# Patient Record
Sex: Female | Born: 1935 | Race: Black or African American | Hispanic: No | Smoking: Never smoker
Health system: Southern US, Community
[De-identification: ages and names within clinical notes are randomized; demographics above are authoritative.]

## PROBLEM LIST (undated history)

## (undated) DIAGNOSIS — K8689 Other specified diseases of pancreas: Secondary | ICD-10-CM

## (undated) DIAGNOSIS — E079 Disorder of thyroid, unspecified: Secondary | ICD-10-CM

## (undated) DIAGNOSIS — J45909 Unspecified asthma, uncomplicated: Secondary | ICD-10-CM

## (undated) DIAGNOSIS — E119 Type 2 diabetes mellitus without complications: Secondary | ICD-10-CM

## (undated) DIAGNOSIS — I10 Essential (primary) hypertension: Secondary | ICD-10-CM

## (undated) HISTORY — PX: CHOLECYSTECTOMY: SHX55

## (undated) HISTORY — PX: THYROID SURGERY: SHX805

## (undated) HISTORY — PX: APPENDECTOMY: SHX54

---

## 1999-01-21 ENCOUNTER — Ambulatory Visit (HOSPITAL_COMMUNITY): Admission: RE | Admit: 1999-01-21 | Discharge: 1999-01-21 | Payer: Self-pay

## 2004-10-01 ENCOUNTER — Emergency Department (HOSPITAL_COMMUNITY): Admission: EM | Admit: 2004-10-01 | Discharge: 2004-10-01 | Payer: Self-pay | Admitting: Emergency Medicine

## 2004-10-04 ENCOUNTER — Ambulatory Visit (HOSPITAL_COMMUNITY): Admission: RE | Admit: 2004-10-04 | Discharge: 2004-10-04 | Payer: Self-pay | Admitting: Orthopedic Surgery

## 2004-11-30 ENCOUNTER — Encounter: Admission: RE | Admit: 2004-11-30 | Discharge: 2004-12-22 | Payer: Self-pay | Admitting: Orthopedic Surgery

## 2005-07-13 ENCOUNTER — Emergency Department (HOSPITAL_COMMUNITY): Admission: EM | Admit: 2005-07-13 | Discharge: 2005-07-13 | Payer: Self-pay | Admitting: Emergency Medicine

## 2005-07-28 ENCOUNTER — Ambulatory Visit (HOSPITAL_BASED_OUTPATIENT_CLINIC_OR_DEPARTMENT_OTHER): Admission: RE | Admit: 2005-07-28 | Discharge: 2005-07-28 | Payer: Self-pay | Admitting: Cardiology

## 2005-08-01 ENCOUNTER — Ambulatory Visit: Payer: Self-pay | Admitting: Internal Medicine

## 2006-05-10 ENCOUNTER — Encounter: Admission: RE | Admit: 2006-05-10 | Discharge: 2006-05-10 | Payer: Self-pay | Admitting: Cardiology

## 2006-05-15 ENCOUNTER — Encounter (HOSPITAL_COMMUNITY): Admission: RE | Admit: 2006-05-15 | Discharge: 2006-08-13 | Payer: Self-pay | Admitting: Cardiology

## 2006-05-22 ENCOUNTER — Encounter (INDEPENDENT_AMBULATORY_CARE_PROVIDER_SITE_OTHER): Payer: Self-pay | Admitting: Specialist

## 2006-05-22 ENCOUNTER — Encounter: Admission: RE | Admit: 2006-05-22 | Discharge: 2006-05-22 | Payer: Self-pay | Admitting: Cardiology

## 2006-05-22 ENCOUNTER — Other Ambulatory Visit: Admission: RE | Admit: 2006-05-22 | Discharge: 2006-05-22 | Payer: Self-pay | Admitting: Interventional Radiology

## 2006-07-26 ENCOUNTER — Encounter (INDEPENDENT_AMBULATORY_CARE_PROVIDER_SITE_OTHER): Payer: Self-pay | Admitting: *Deleted

## 2006-07-26 ENCOUNTER — Ambulatory Visit (HOSPITAL_COMMUNITY): Admission: RE | Admit: 2006-07-26 | Discharge: 2006-07-27 | Payer: Self-pay | Admitting: General Surgery

## 2007-04-11 ENCOUNTER — Encounter: Admission: RE | Admit: 2007-04-11 | Discharge: 2007-04-11 | Payer: Self-pay | Admitting: Cardiology

## 2008-05-19 ENCOUNTER — Ambulatory Visit (HOSPITAL_COMMUNITY): Admission: RE | Admit: 2008-05-19 | Discharge: 2008-05-19 | Payer: Self-pay | Admitting: Cardiology

## 2008-08-05 ENCOUNTER — Ambulatory Visit: Admission: RE | Admit: 2008-08-05 | Discharge: 2008-08-05 | Payer: Self-pay | Admitting: Endocrinology

## 2010-01-05 ENCOUNTER — Encounter: Admission: RE | Admit: 2010-01-05 | Discharge: 2010-01-05 | Payer: Self-pay | Admitting: Cardiology

## 2010-12-19 ENCOUNTER — Encounter: Payer: Self-pay | Admitting: Cardiology

## 2011-04-15 NOTE — Procedures (Signed)
NAME:  [name], [name]                  ACCOUNT NO.:  [id number]   MEDICAL RECORD NO.:  [id number]          PATIENT TYPE:  OUT   LOCATION:  SLEEP CENTER                 FACILITY:  [facility]   PHYSICIAN:  Clinton D. Maple Hudson, M.D. DATE OF BIRTH:  [birth year]   DATE OF STUDY:  07/28/2005                              NOCTURNAL POLYSOMNOGRAM   REFERRING PHYSICIAN:  Dr. Kevin Fenton Spruill   DATE OF STUDY:  July 28, 2005.   INDICATION FOR STUDY:  Hypersomnia with sleep apnea. Epworth Sleepiness  Score 5/24, BMI 34, weight 172 pounds.   SLEEP ARCHITECTURE:  Total sleep time 340 minutes with sleep efficiency 62%.  Stage I was 2%, stage II 65%, stages III and IV 21%, REM was 13% of total  sleep time. Sleep latency 48 minutes, REM latency 126 minutes, awake after  sleep onset 156 minutes, arousal index 11.5. Bedtime medication was aspirin.   RESPIRATORY DATA:  Split study protocol. Apnea-hypopnea index (AHI, RDI) 9.4  obstructive events per hour indicating mild obstructive sleep apnea/hypopnea  syndrome before CPAP. There were 1 central apnea, 15 obstructive apneas and  37 hypopneas before CPAP. CPAP was titrated to 8 CWP, AHI 1 per hour, using  a petite ComfortGel Mask with heated humidifier.   OXYGEN DATA:  Mild snoring with oxygen desaturation to a nadir of 87% before  CPAP. After CPAP control saturation held 94-98% on room air.   CARDIAC DATA:  Normal sinus rhythm.   MOVEMENT/PARASOMNIA:  Occasional leg jerks with little effect on sleep.   IMPRESSION/RECOMMENDATION:  1.  Mild obstructive sleep apnea/hypopnea syndrome, apnea-hypopnea index 9.4      per hour, with light snoring and oxygen desaturation to 87%.  2.  Continuous positive airway pressure titrated to 8 CWP, apnea-hypopnea      index of 1 per hour, using a petite ComfortGel Mask with heated      humidifier.     Clinton D. Maple Hudson, M.D.  Diplomate, Biomedical engineer of Sleep Medicine  Electronically Signed    CDY/MEDQ  D:  07/31/2005  15:31:13  T:  07/31/2005 22:18:32  Job:  [id number]

## 2011-04-15 NOTE — Op Note (Signed)
[name], [name]                  ACCOUNT NO.:  [id number]   MEDICAL RECORD NO.:  [id number]          PATIENT TYPE:  OIB   LOCATION:  5705                         FACILITY:  [facility]   PHYSICIAN:  Leonie Man, M.D.   DATE OF BIRTH:  02-18-1928   DATE OF PROCEDURE:  07/26/2006  DATE OF DISCHARGE:                                 OPERATIVE REPORT   PREOPERATIVE DIAGNOSIS:  Multinodular goiter, rule out carcinoma.   POSTOPERATIVE DIAGNOSIS:  Multinodular goiter, rule out carcinoma.   PROCEDURE:  Total thyroidectomy.   SURGEON:  Leonie Man, M.D.   ASSISTANT:  Lebron Conners, M.D.   ANESTHESIA:  General.   SPECIMENS TO LAB:  Thyroid gland.   ESTIMATED BLOOD LOSS:  Minimal.   COMPLICATIONS:  None apparent.   DISPOSITION:  The patient returned to the PACU in excellent condition.   INDICATIONS:  [name] is a 77 year old lady who discovered a large mass  in her neck.  She underwent ultrasound and was noted to have a large cold  nodule occupying the left lobe of her thyroid which, on biopsy, did not show  any evidence of carcinoma.  The lobe on the left measures 6.8 cm and the  thyroid lobe on the right measures 6.7 cm.  The patient had not been having  any significant symptoms of dysphasia or odynophagia.  She was not hoarse.  She comes to the operating room now for total thyroidectomy after the risks  and potential benefits of surgery have been discussed.  All questions were  answered, consent obtained for surgery.  The patient is aware of the risk of  hypoparathyroidism as well as recurrent laryngeal nerve injury.  She is  aware that she is going to be hypothyroid following surgery and will have to  be on medication for this for the rest of her life.   FINDINGS:  The left thyroid lobe was large, smooth, and white. The right  lobe of the thyroid was multinodular and multiple small lobes extending all  over the thyroid gland.  The thyroid isthmus had a large nodule within  it.  There was no pyramidal lobe noted.  There were no nodes of any significance  noted within the anterior compartments of the neck.   PROCEDURE:  The patient is positioned supinely with the head and neck  hyperextended.  The neck is prepped and draped to be included in a sterile  operative field.  A transverse incision is made through the skin,  subcutaneous tissues, and platysma with a superior flap being raised up to  the thyroid cartilage and an inferior flap carried down to the sternal  notch.  The midline strap muscles are opened up and dissection of the  thyroid was carried down initially on the left side.  The findings are as  noted above. The left lobe was mobilized up to the upper pole where the  upper pole vessels are isolated and doubly ligated and clipped and then  transected.  The lobe was then fully mobilized from out of the neck and  dissection carried down along the  thyroid gland being careful to avoid the  recurrent laryngeal nerve and to spare both the upper and lower pole  parathyroid glands.  Hemostasis was obtained along the course of dissection  with clips.  The lobe was then rolled medially over towards the trachea and  the lobe was dissected free from the trachea and this was carried across the  midline.  Attention was then turned to the right lobe at which point, the  right lobe was also mobilized from under the strap muscles and dissection  carried up towards the upper pole and the upper pole vessels were isolated  and ligated with 2-0 silk sutures and then reinforced with a medium clip.  The right upper pole vessels were then divided and the thyroid rolled  medially.  Both the right and left recurrent laryngeal nerves were  positively identified and spared throughout the course of the dissection as  the thyroid was dissected free from its attachments to the trachea.  Hemostasis was obtained with clips and with electrocautery as necessary.  The entire thyroid  gland was then dissected free from off of the trachea,  removed and forwarded for pathologic evaluation.  The right lobe upper pole  was identified with a single suture.  Both the right and left portions of  the neck were irrigated with normal saline.  Additional bleeding points  treated electrocautery.  Sponge and instrument counts were verified.  A  patch of Surgicel was placed in both sides of the neck for additional  hemostasis.  Sponge and instrument counts were verified.  The midline strap  muscles were closed with running 3-0 Vicryl suture.  The platysma was closed  with interrupted Vicryl sutures and the skin was closed with running 5-0  Monocryl suture and then reinforced with Steri-Strips.  Sterile dressings  were applied.  The anesthetic was reversed.  The patient was then removed  from the operating room to the recovery room in stable condition.  She  tolerated the procedure well.      Leonie Man, M.D.  Electronically Signed     PB/MEDQ  D:  07/26/2006  T:  07/26/2006  Job:  [id number]

## 2011-04-15 NOTE — Op Note (Signed)
[name], [name]                  ACCOUNT NO.:  [id number]   MEDICAL RECORD NO.:  [id number]          PATIENT TYPE:  OIB   LOCATION:  2899                         FACILITY:  [facility]   PHYSICIAN:  Mila Homer. Sherlean Foot, M.D. DATE OF BIRTH:  05-07-1928   DATE OF PROCEDURE:  10/04/2004  DATE OF DISCHARGE:                                 OPERATIVE REPORT   PREOPERATIVE DIAGNOSIS:  Left lateral malleolus fracture.   POSTOPERATIVE DIAGNOSIS:  Left lateral malleolus fracture.   PROCEDURE:  Left lateral malleolus open reduction and internal fixation.   SURGEON:  Mila Homer. Sherlean Foot, M.D.   ASSISTANT:  Jamelle Rushing, P.A.   ANESTHESIA:  General.   INDICATIONS FOR PROCEDURE:  The patient is a 77 year old who fell on  Thursday, and came in to see me on Friday in the office.  An informed  consent was obtained for an elective ORIF of the lateral malleolus plus or  minus medial reconstruction.   DESCRIPTION OF PROCEDURE:  The patient was laid supine and administered  general anesthesia.  The left lower extremity was prepped and draped in the  usual sterile fashion.  A straight lateral incision was made over the  lateral malleolus and went down sharply to the fibula in the proximal 50% of  the wound, and in the distal 50% of the wound I used blunt dissection, to  take care not to injure the superficial peroneal nerve.  I then identified  the fracture and cleaned out the fracture site with a knife, a dental pick  and irrigation.  I then used a standard bone reduction forceps to reduce the  fracture and to hold it in place.  I then drilled a lag screw from anterior  to posterior, and measured 20 mm.  I then fashioned a semi-tubular plate to  the lateral cortex of the fibula and filled two screws distally with full-  threaded cancellus screws and then four screws proximally with bicortical  screws.  I then checked on C-arm to insure we had good anatomic alignment of  both medial and lateral sides, as  well as the posterior plafond on C-arm.  Everything was anatomically reduced, so I then irrigated and closed with  interrupted #0 and #2-0 Vicryl and skin staples.  Dressed with Adaptic, 4 x  4s, posterior splint, sterile Webril and an Ace wrap.   COMPLICATIONS:  None.   DRAINS:  None.   TOURNIQUET TIME:  Was 21 minutes.       SDL/MEDQ  D:  10/04/2004  T:  10/04/2004  Job:  [id number]

## 2011-08-25 LAB — COMPREHENSIVE METABOLIC PANEL
ALT: 10
AST: 29
CO2: 30
Chloride: 100
Creatinine, Ser: 1.23 — ABNORMAL HIGH
GFR calc Af Amer: 51 — ABNORMAL LOW
GFR calc non Af Amer: 42 — ABNORMAL LOW
Sodium: 139
Total Bilirubin: 0.6

## 2011-08-25 LAB — URINALYSIS, MICROSCOPIC ONLY
Bilirubin Urine: NEGATIVE
Glucose, UA: NEGATIVE
Hgb urine dipstick: NEGATIVE
Ketones, ur: NEGATIVE
Nitrite: NEGATIVE
Protein, ur: NEGATIVE
Specific Gravity, Urine: 1.015
Urobilinogen, UA: 0.2
pH: 7.5

## 2011-08-25 LAB — CBC
Hemoglobin: 11.1 — ABNORMAL LOW
MCV: 75.6 — ABNORMAL LOW
RBC: 4.64
WBC: 5.9

## 2011-08-25 LAB — TSH: TSH: 3.786

## 2011-08-25 LAB — T4: T4, Total: 8.2

## 2012-04-24 ENCOUNTER — Other Ambulatory Visit: Payer: Self-pay | Admitting: Cardiology

## 2013-06-26 ENCOUNTER — Emergency Department (INDEPENDENT_AMBULATORY_CARE_PROVIDER_SITE_OTHER)
Admission: EM | Admit: 2013-06-26 | Discharge: 2013-06-26 | Disposition: A | Discharge status: Home / Self Care | Payer: Medicare Other | Source: Home / Self Care | Attending: Family Medicine | Admitting: Family Medicine

## 2013-06-26 ENCOUNTER — Encounter (HOSPITAL_COMMUNITY): Payer: Self-pay | Admitting: *Deleted

## 2013-06-26 ENCOUNTER — Ambulatory Visit (HOSPITAL_COMMUNITY): Payer: Medicare Other | Attending: Family Medicine

## 2013-06-26 DIAGNOSIS — J45901 Unspecified asthma with (acute) exacerbation: Secondary | ICD-10-CM

## 2013-06-26 DIAGNOSIS — R059 Cough, unspecified: Secondary | ICD-10-CM | POA: Insufficient documentation

## 2013-06-26 DIAGNOSIS — R05 Cough: Secondary | ICD-10-CM | POA: Insufficient documentation

## 2013-06-26 DIAGNOSIS — J189 Pneumonia, unspecified organism: Secondary | ICD-10-CM

## 2013-06-26 DIAGNOSIS — R9389 Abnormal findings on diagnostic imaging of other specified body structures: Secondary | ICD-10-CM | POA: Insufficient documentation

## 2013-06-26 HISTORY — DX: Disorder of thyroid, unspecified: E07.9

## 2013-06-26 HISTORY — DX: Type 2 diabetes mellitus without complications: E11.9

## 2013-06-26 HISTORY — DX: Essential (primary) hypertension: I10

## 2013-06-26 HISTORY — DX: Unspecified asthma, uncomplicated: J45.909

## 2013-06-26 MED ORDER — PREDNISONE 10 MG PO TABS: ORAL_TABLET | ORAL | Status: DC

## 2013-06-26 MED ORDER — ALBUTEROL SULFATE (5 MG/ML) 0.5% IN NEBU
2.5000 mg | INHALATION_SOLUTION | Freq: Once | RESPIRATORY_TRACT | Status: AC
  Administered 2013-06-26: 2.5 mg via RESPIRATORY_TRACT

## 2013-06-26 MED ORDER — ALBUTEROL SULFATE (5 MG/ML) 0.5% IN NEBU
INHALATION_SOLUTION | RESPIRATORY_TRACT | Status: AC
  Filled 2013-06-26: qty 0.5

## 2013-06-26 MED ORDER — IPRATROPIUM BROMIDE 0.02 % IN SOLN
0.2500 mg | Freq: Once | RESPIRATORY_TRACT | Status: AC
  Administered 2013-06-26: 0.26 mg via RESPIRATORY_TRACT

## 2013-06-26 MED ORDER — ALBUTEROL SULFATE HFA 108 (90 BASE) MCG/ACT IN AERS: 1.0000 | INHALATION_SPRAY | Freq: Four times a day (QID) | RESPIRATORY_TRACT | Status: DC | PRN

## 2013-06-26 MED ORDER — METHYLPREDNISOLONE SODIUM SUCC 125 MG IJ SOLR
INTRAMUSCULAR | Status: AC
  Filled 2013-06-26: qty 2

## 2013-06-26 MED ORDER — DOXYCYCLINE HYCLATE 100 MG PO CAPS: 100.0000 mg | ORAL_CAPSULE | Freq: Two times a day (BID) | ORAL | Status: DC

## 2013-06-26 MED ORDER — METHYLPREDNISOLONE SODIUM SUCC 125 MG IJ SOLR
80.0000 mg | Freq: Once | INTRAMUSCULAR | Status: AC
  Administered 2013-06-26: 80 mg via INTRAMUSCULAR

## 2013-06-26 MED ORDER — IPRATROPIUM BROMIDE 0.02 % IN SOLN: 0.2500 mg | Freq: Once | RESPIRATORY_TRACT | Status: DC

## 2013-06-26 MED ORDER — BENZONATATE 100 MG PO CAPS: 100.0000 mg | ORAL_CAPSULE | Freq: Three times a day (TID) | ORAL | Status: DC

## 2013-06-26 NOTE — ED Notes (Signed)
Pt reports she inhaled some Raid insect repellant about a week ago.  The next day she started coughing  and noticed a runny nose.  She has also vomited about once daily  She denies fever or diarrhea.

## 2013-06-26 NOTE — ED Provider Notes (Signed)
CSN: [id number]     Arrival date & time 06/26/13  1216 History     First MD Initiated Contact with Patient 06/26/13 1317     Chief Complaint  Patient presents with  . Cough   (Consider location/radiation/quality/duration/timing/severity/associated sxs/prior Treatment) HPI Comments: An 79 year old female with remote history of asthma, diabetes and hypothyroidism among other comorbidities. Here complaining of persistent cough associated with episodes of wheezing intermittently during the last 6 days. Also reports intermittent clear phlegm with coughing and post tussive non bloody emesis. Denies current nasal congestion, sore throat, fever or chills. Also denies chest pain or painful breathing. Appetite is at base line. Pt. States she thinks symptoms were triggered after accidentally inhaling an aerosol insect repellant she used on her dog about a week ago. Patient reports that she has used sporadically an inhaler in the past given by her daughter who is a respiratory therapist.    Past Medical History  Diagnosis Date  . Asthma   . Diabetes mellitus without complication   . Hypertension   . Thyroid disease    Past Surgical History  Procedure Laterality Date  . Appendectomy    . Cholecystectomy    . Thyroid surgery     No family history on file. History  Substance Use Topics  . Smoking status: Never Smoker   . Smokeless tobacco: Not on file  . Alcohol Use: No   OB History   Grav Para Term Preterm Abortions TAB SAB Ect Mult Living                 Review of Systems  Constitutional: Negative for fever, chills, diaphoresis, appetite change and fatigue.  HENT: Negative for sore throat and rhinorrhea.   Eyes: Negative for discharge.  Respiratory: Positive for cough and wheezing.   Cardiovascular: Negative for chest pain, palpitations and leg swelling.  Gastrointestinal: Negative for nausea and abdominal pain.  Skin: Negative for rash.  Neurological: Negative for dizziness,  syncope and headaches.  All other systems reviewed and are negative.    Allergies  Review of patient's allergies indicates no known allergies.  Home Medications   Current Outpatient Rx  Name  Route  Sig  Dispense  Refill  . amLODipine-valsartan (EXFORGE) 5-160 MG per tablet   Oral   Take 1 tablet by mouth daily.         Marland Kitchen levothyroxine (SYNTHROID, LEVOTHROID) 112 MCG tablet   Oral   Take 112 mcg by mouth daily before breakfast.         . metFORMIN (GLUCOPHAGE) 500 MG tablet   Oral   Take 500 mg by mouth 2 (two) times daily with a meal.         . rosuvastatin (CRESTOR) 5 MG tablet   Oral   Take 5 mg by mouth daily.         Marland Kitchen albuterol (PROVENTIL HFA;VENTOLIN HFA) 108 (90 BASE) MCG/ACT inhaler   Inhalation   Inhale 1-2 puffs into the lungs every 6 (six) hours as needed for wheezing.   1 Inhaler   0   . benzonatate (TESSALON) 100 MG capsule   Oral   Take 1 capsule (100 mg total) by mouth every 8 (eight) hours.   21 capsule   0   . doxycycline (VIBRAMYCIN) 100 MG capsule   Oral   Take 1 capsule (100 mg total) by mouth 2 (two) times daily.   20 capsule   0   . predniSONE (DELTASONE) 10 MG tablet  Take  4tabs po on day #1 then          3 tabs po on day#2         2 tbas po on day#3         1 tab po on  day#4       1/2 tab po on day#5   11 tablet   0    BP 105/66  Pulse 84  Temp(Src) 98.2 F (36.8 C) (Oral)  Resp 20  SpO2 98% Physical Exam  Nursing note and vitals reviewed. Constitutional: She is oriented to person, place, and time. She appears well-developed and well-nourished. No distress.  HENT:  Head: Normocephalic and atraumatic.  Right Ear: External ear normal.  Left Ear: External ear normal.  Nose: Nose normal.  Mouth/Throat: Oropharynx is clear and moist. No oropharyngeal exudate.  Eyes: Conjunctivae and EOM are normal. Pupils are equal, round, and reactive to light. Right eye exhibits no discharge. Left eye exhibits no discharge. No  scleral icterus.  Neck: Neck supple. No JVD present. No thyromegaly present.  Cardiovascular: Normal rate, regular rhythm and normal heart sounds.  Exam reveals no gallop and no friction rub.   No murmur heard. Pulmonary/Chest:  (Prior nebulization) Bilateral expiratory rhonchi, no tachypnea, no orthopnea, no crackles.  Abdominal: Soft. There is no tenderness.  Lymphadenopathy:    She has no cervical adenopathy.  Neurological: She is alert and oriented to person, place, and time.  Skin: No rash noted. She is not diaphoretic.    ED Course   Procedures (including critical care time)  Labs Reviewed - No data to display Dg Chest 2 View  06/26/2013   *RADIOLOGY REPORT*  Clinical Data: Cough  CHEST - 2 VIEW  Comparison: January 05, 2010.  Findings: Cardiomediastinal silhouette appears normal.  Left lung is clear.  No pneumothorax or pleural effusion is noted.  Minimal linear density is noted in the right upper lobe concerning for subsegmental atelectasis or possibly early pneumonia.  Bony thorax is intact.  IMPRESSION: New faint right upper lobe opacity concerning for subsegmental atelectasis or possibly pneumonia.   Original Report Authenticated By: Lupita Raider.,  M.D.   1. Asthma exacerbation, mild   2. Community acquired pneumonia     MDM  Clinical impression of asthma exacerbation. Otherwise patient is clinically well. Still decided to cover with doxycycline for possible early signs of community acquire pneumonia in right upper lobe. Was treated here with albuterol nebulization 2.5 mg x2, ipratropium 2.5 mcg x1 and Solu-Medrol 80 mg IM x1 with significant improvement of her symptoms and lung examination. Prescribed prednisone taper, albuterol inhaler, Tessalon Perles and doxycycline. Was asked to follow up with her primary care provider in the next 3-5 days to monitor her symptoms and recovery. Patient was asked to return to medical attention if worsening or new symptoms despite  following treatment.  Sharin Grave, MD 06/27/13 [phone]

## 2014-12-28 ENCOUNTER — Encounter (HOSPITAL_COMMUNITY): Payer: Self-pay | Admitting: Cardiology

## 2014-12-28 ENCOUNTER — Emergency Department (HOSPITAL_COMMUNITY): Payer: Medicare Other

## 2014-12-28 ENCOUNTER — Emergency Department (HOSPITAL_COMMUNITY)
Admission: EM | Admit: 2014-12-28 | Discharge: 2014-12-28 | Disposition: A | Discharge status: Home / Self Care | Payer: Medicare Other | Attending: Emergency Medicine | Admitting: Emergency Medicine

## 2014-12-28 DIAGNOSIS — I10 Essential (primary) hypertension: Secondary | ICD-10-CM | POA: Insufficient documentation

## 2014-12-28 DIAGNOSIS — E1165 Type 2 diabetes mellitus with hyperglycemia: Secondary | ICD-10-CM | POA: Diagnosis not present

## 2014-12-28 DIAGNOSIS — R0789 Other chest pain: Secondary | ICD-10-CM

## 2014-12-28 DIAGNOSIS — Z792 Long term (current) use of antibiotics: Secondary | ICD-10-CM | POA: Diagnosis not present

## 2014-12-28 DIAGNOSIS — E079 Disorder of thyroid, unspecified: Secondary | ICD-10-CM | POA: Insufficient documentation

## 2014-12-28 DIAGNOSIS — J45909 Unspecified asthma, uncomplicated: Secondary | ICD-10-CM | POA: Insufficient documentation

## 2014-12-28 DIAGNOSIS — R079 Chest pain, unspecified: Secondary | ICD-10-CM | POA: Diagnosis present

## 2014-12-28 DIAGNOSIS — Z7952 Long term (current) use of systemic steroids: Secondary | ICD-10-CM | POA: Insufficient documentation

## 2014-12-28 DIAGNOSIS — E876 Hypokalemia: Secondary | ICD-10-CM | POA: Insufficient documentation

## 2014-12-28 DIAGNOSIS — Z79899 Other long term (current) drug therapy: Secondary | ICD-10-CM | POA: Insufficient documentation

## 2014-12-28 DIAGNOSIS — R739 Hyperglycemia, unspecified: Secondary | ICD-10-CM

## 2014-12-28 LAB — I-STAT TROPONIN, ED: Troponin i, poc: 0 ng/mL (ref 0.00–0.08)

## 2014-12-28 LAB — BASIC METABOLIC PANEL
Anion gap: 11 (ref 5–15)
BUN: 14 mg/dL (ref 6–23)
CALCIUM: 8.6 mg/dL (ref 8.4–10.5)
CHLORIDE: 93 mmol/L — AB (ref 96–112)
CO2: 25 mmol/L (ref 19–32)
CREATININE: 0.83 mg/dL (ref 0.50–1.10)
GFR calc non Af Amer: 63 mL/min — ABNORMAL LOW (ref >90)
GFR, EST AFRICAN AMERICAN: 73 mL/min — AB (ref >90)
Glucose, Bld: 309 mg/dL — ABNORMAL HIGH (ref 70–99)
POTASSIUM: 3.4 mmol/L — AB (ref 3.5–5.1)
SODIUM: 129 mmol/L — AB (ref 135–145)

## 2014-12-28 LAB — CBC
HEMATOCRIT: 29 % — AB (ref 36.0–46.0)
Hemoglobin: 9.2 g/dL — ABNORMAL LOW (ref 12.0–15.0)
MCH: 23.2 pg — ABNORMAL LOW (ref 26.0–34.0)
MCHC: 31.7 g/dL (ref 30.0–36.0)
MCV: 73 fL — AB (ref 78.0–100.0)
Platelets: 440 10*3/uL — ABNORMAL HIGH (ref 150–400)
RBC: 3.97 MIL/uL (ref 3.87–5.11)
RDW: 14.9 % (ref 11.5–15.5)
WBC: 7.5 10*3/uL (ref 4.0–10.5)

## 2014-12-28 LAB — CBG MONITORING, ED
GLUCOSE-CAPILLARY: 257 mg/dL — AB (ref 70–99)
GLUCOSE-CAPILLARY: 153 mg/dL — AB (ref 70–99)

## 2014-12-28 LAB — BRAIN NATRIURETIC PEPTIDE: B NATRIURETIC PEPTIDE 5: 107 pg/mL — AB (ref 0.0–100.0)

## 2014-12-28 MED ORDER — SODIUM CHLORIDE 0.9 % IV BOLUS (SEPSIS)
500.0000 mL | Freq: Once | INTRAVENOUS | Status: AC
  Administered 2014-12-28: 500 mL via INTRAVENOUS

## 2014-12-28 MED ORDER — INSULIN ASPART 100 UNIT/ML ~~LOC~~ SOLN
6.0000 [IU] | Freq: Once | SUBCUTANEOUS | Status: AC
  Administered 2014-12-28: 6 [IU] via SUBCUTANEOUS
  Filled 2014-12-28: qty 1

## 2014-12-28 MED ORDER — POTASSIUM CHLORIDE CRYS ER 20 MEQ PO TBCR
20.0000 meq | EXTENDED_RELEASE_TABLET | Freq: Once | ORAL | Status: AC
  Administered 2014-12-28: 20 meq via ORAL
  Filled 2014-12-28: qty 1

## 2014-12-28 NOTE — ED Notes (Signed)
CBG 153 

## 2014-12-28 NOTE — ED Notes (Signed)
Pt reports she started having some right sided chest pain this morning. States the alarm was triggered in her home and then she started having the pain. Pt denies any SOb or n/v.

## 2014-12-28 NOTE — ED Notes (Signed)
NAD at this time. Pt is stable and leaving with her family.

## 2014-12-28 NOTE — ED Provider Notes (Signed)
CSN: [id number]     Arrival date & time 12/28/14  0911 History   First MD Initiated Contact with Patient 12/28/14 0920     Chief Complaint  Patient presents with  . Chest Pain     (Consider location/radiation/quality/duration/timing/severity/associated sxs/prior Treatment) Patient is a 81 y.o. female presenting with chest pain. The history is provided by the patient.  Chest Pain Associated symptoms: no abdominal pain, no back pain, no cough, no fever, no headache, no nausea, no palpitations, no shortness of breath and not vomiting   pt c/o onset mid to right sided cp last night after home alarm kept going off. Was at rest at time of onset symptoms. Pain constant. Dull, mild, non radiating. No associated nv, diaphoresis or sob. Pain is not pleuritic. Pt denies any unusual doe or fatigue. No other recent cp or discomfort. Pt states she 'got to thinking and didn't want to be one of those people who waits too long'.   Pt denies hx cad or fam hx cad. No cough or uri c/o. No fever or chills. No chest wall injury or strain. No leg pain or swelling. No recent surgery or immobility. No hx dvt or pe. Non smoker.  States now that she has arrived in hospitals symptoms improved/resolved.     Past Medical History  Diagnosis Date  . Asthma   . Diabetes mellitus without complication   . Hypertension   . Thyroid disease    Past Surgical History  Procedure Laterality Date  . Appendectomy    . Cholecystectomy    . Thyroid surgery     History reviewed. No pertinent family history. History  Substance Use Topics  . Smoking status: Never Smoker   . Smokeless tobacco: Not on file  . Alcohol Use: No   OB History    No data available     Review of Systems  Constitutional: Negative for fever and chills.  HENT: Negative for sore throat.   Eyes: Negative for redness.  Respiratory: Negative for cough and shortness of breath.   Cardiovascular: Positive for chest pain. Negative for palpitations and leg  swelling.  Gastrointestinal: Negative for nausea, vomiting and abdominal pain.  Genitourinary: Negative for flank pain.  Musculoskeletal: Negative for back pain and neck pain.  Skin: Negative for rash.  Neurological: Negative for headaches.  Hematological: Does not bruise/bleed easily.  Psychiatric/Behavioral: Negative for confusion.      Allergies  Review of patient's allergies indicates no known allergies.  Home Medications   Prior to Admission medications   Medication Sig Start Date End Date Taking? Authorizing Provider  albuterol (PROVENTIL HFA;VENTOLIN HFA) 108 (90 BASE) MCG/ACT inhaler Inhale 1-2 puffs into the lungs every 6 (six) hours as needed for wheezing. 06/26/13   Adlih Moreno-Coll, MD  amLODipine-valsartan (EXFORGE) 5-160 MG per tablet Take 1 tablet by mouth daily.    Historical Provider, MD  benzonatate (TESSALON) 100 MG capsule Take 1 capsule (100 mg total) by mouth every 8 (eight) hours. 06/26/13   Adlih Moreno-Coll, MD  doxycycline (VIBRAMYCIN) 100 MG capsule Take 1 capsule (100 mg total) by mouth 2 (two) times daily. 06/26/13   Adlih Moreno-Coll, MD  levothyroxine (SYNTHROID, LEVOTHROID) 112 MCG tablet Take 112 mcg by mouth daily before breakfast.    Historical Provider, MD  metFORMIN (GLUCOPHAGE) 500 MG tablet Take 500 mg by mouth 2 (two) times daily with a meal.    Historical Provider, MD  predniSONE (DELTASONE) 10 MG tablet Take  4tabs po on day #1 then  3 tabs po on day#2         2 tbas po on day#3         1 tab po on  day#4       1/2 tab po on day#5 06/26/13   Adlih Moreno-Coll, MD  rosuvastatin (CRESTOR) 5 MG tablet Take 5 mg by mouth daily.    Historical Provider, MD   BP 170/73 mmHg  Pulse 112  Temp(Src) 97.9 F (36.6 C) (Oral)  Resp 18  SpO2 96% Physical Exam  Constitutional: She appears well-developed and well-nourished. No distress.  HENT:  Mouth/Throat: Oropharynx is clear and moist.  Eyes: Conjunctivae are normal. No scleral icterus.  Neck:  Neck supple. No tracheal deviation present.  Cardiovascular: Normal rate, regular rhythm, normal heart sounds and intact distal pulses.  Exam reveals no gallop and no friction rub.   No murmur heard. Pulmonary/Chest: Effort normal. No respiratory distress. She exhibits no tenderness.  Abdominal: Soft. Normal appearance. She exhibits no distension. There is no tenderness.  Genitourinary:  No cva tenderness  Musculoskeletal: She exhibits no edema or tenderness.  Neurological: She is alert.  Skin: Skin is warm and dry. No rash noted. She is not diaphoretic.  No shingles/rash in area of pain.   Psychiatric: She has a normal mood and affect.  Nursing note and vitals reviewed.   ED Course  Procedures (including critical care time) Labs Review  Results for orders placed or performed during the hospital encounter of 12/28/14  CBC  Result Value Ref Range   WBC 7.5 4.0 - 10.5 K/uL   RBC 3.97 3.87 - 5.11 MIL/uL   Hemoglobin 9.2 (L) 12.0 - 15.0 g/dL   HCT 29.0 (L) 36.0 - 46.0 %   MCV 73.0 (L) 78.0 - 100.0 fL   MCH 23.2 (L) 26.0 - 34.0 pg   MCHC 31.7 30.0 - 36.0 g/dL   RDW 14.9 11.5 - 15.5 %   Platelets 440 (H) 150 - 400 K/uL  Basic metabolic panel  Result Value Ref Range   Sodium 129 (L) 135 - 145 mmol/L   Potassium 3.4 (L) 3.5 - 5.1 mmol/L   Chloride 93 (L) 96 - 112 mmol/L   CO2 25 19 - 32 mmol/L   Glucose, Bld 309 (H) 70 - 99 mg/dL   BUN 14 6 - 23 mg/dL   Creatinine, Ser 0.83 0.50 - 1.10 mg/dL   Calcium 8.6 8.4 - 10.5 mg/dL   GFR calc non Af Amer 63 (L) >90 mL/min   GFR calc Af Amer 73 (L) >90 mL/min   Anion gap 11 5 - 15  BNP (order ONLY if patient complains of dyspnea/SOB AND you have documented it for THIS visit)  Result Value Ref Range   B Natriuretic Peptide 107.0 (H) 0.0 - 100.0 pg/mL  I-stat troponin, ED (not at [facility])  Result Value Ref Range   Troponin i, poc 0.00 0.00 - 0.08 ng/mL   Comment 3           Dg Chest 2 View  12/28/2014   CLINICAL DATA:  Left-sided chest  pain beginning the night of 12/27/2014.  EXAM: CHEST  2 VIEW  COMPARISON:  PA and lateral chest 06/26/2013 and 01/05/2010.  FINDINGS: The lungs are clear. The imaged heart size is normal. No pneumothorax or pleural effusion. No focal bony abnormality.  IMPRESSION: No acute disease.   Electronically Signed   By: Inge Rise M.D.   On: 12/28/2014 10:09  EKG Interpretation   Date/Time:  Sunday December 28 2014 09:18:04 EST Ventricular Rate:  103 PR Interval:  126 QRS Duration: 70 QT Interval:  336 QTC Calculation: 440 R Axis:   49 Text Interpretation:  Sinus tachycardia Nonspecific T wave abnormality  Baseline wander No significant change since last tracing Confirmed by  Nahsir Venezia  MD, Lennette Bihari ([id number]) on 12/28/2014 9:29:59 AM      MDM   Iv ns. Labs. Ecg. Cxr.  Reviewed nursing notes and prior charts for additional history.   Pt with stress 2009, negative.  No other recent cp or discomfort, no recent unusual doe or fatigue.  After cp since last evening, trop neg/zero.  cxr neg.  Pt w hx dm. bs 300. Iv ns bolus. novolog 6 sq. Po fluids. kcl po.   Pt to f/u w Dr Terrence Dupont this week re recent cp, as well as elevated bs and mildly low k.   Pt taking po fluids well. No cp or sob. Pt currently appears stable for d/c.   Return precautions discussed.     Mirna Mires, MD 12/28/14 [phone]

## 2014-12-28 NOTE — Discharge Instructions (Signed)
It was our pleasure to provide your ER care today - we hope that you feel better.  For chest pain, follow up with Dr Terrence Dupont in the next couple days - call his office tomorrow morning to verify appointment time.   From today's lab tests, your blood sugar is high (300).  Follow diabetic diet, continue your diabetic medication, and follow up with your doctor in the next few days - have blood pressure rechecked then, and discuss possible additional medication.  You are noted to have low blood count/anemia - with hemoglobin of 9.2 -  Follow up with your doctor this week, and discuss plan.   Also from today's labs, your potassium level is slightly low (3.4) - eat plenty of fruits and vegetables, and follow up with your doctor for recheck in 1 week.  Return to ER right away if worse, recurrent or persistent chest pain, trouble breathing, other concern.     Chest Pain (Nonspecific) It is often hard to give a specific diagnosis for the cause of chest pain. There is always a chance that your pain could be related to something serious, such as a heart attack or a blood clot in the lungs. You need to follow up with your health care provider for further evaluation. CAUSES   Heartburn.  Pneumonia or bronchitis.  Anxiety or stress.  Inflammation around your heart (pericarditis) or lung (pleuritis or pleurisy).  A blood clot in the lung.  A collapsed lung (pneumothorax). It can develop suddenly on its own (spontaneous pneumothorax) or from trauma to the chest.  Shingles infection (herpes zoster virus). The chest wall is composed of bones, muscles, and cartilage. Any of these can be the source of the pain.  The bones can be bruised by injury.  The muscles or cartilage can be strained by coughing or overwork.  The cartilage can be affected by inflammation and become sore (costochondritis). DIAGNOSIS  Lab tests or other studies may be needed to find the cause of your pain. Your health care  provider may have you take a test called an ambulatory electrocardiogram (ECG). An ECG records your heartbeat patterns over a 24-hour period. You may also have other tests, such as:  Transthoracic echocardiogram (TTE). During echocardiography, sound waves are used to evaluate how blood flows through your heart.  Transesophageal echocardiogram (TEE).  Cardiac monitoring. This allows your health care provider to monitor your heart rate and rhythm in real time.  Holter monitor. This is a portable device that records your heartbeat and can help diagnose heart arrhythmias. It allows your health care provider to track your heart activity for several days, if needed.  Stress tests by exercise or by giving medicine that makes the heart beat faster. TREATMENT   Treatment depends on what may be causing your chest pain. Treatment may include:  Acid blockers for heartburn.  Anti-inflammatory medicine.  Pain medicine for inflammatory conditions.  Antibiotics if an infection is present.  You may be advised to change lifestyle habits. This includes stopping smoking and avoiding alcohol, caffeine, and chocolate.  You may be advised to keep your head raised (elevated) when sleeping. This reduces the chance of acid going backward from your stomach into your esophagus. Most of the time, nonspecific chest pain will improve within 2-3 days with rest and mild pain medicine.  HOME CARE INSTRUCTIONS   If antibiotics were prescribed, take them as directed. Finish them even if you start to feel better.  For the next few days, avoid physical activities  that bring on chest pain. Continue physical activities as directed.  Do not use any tobacco products, including cigarettes, chewing tobacco, or electronic cigarettes.  Avoid drinking alcohol.  Only take medicine as directed by your health care provider.  Follow your health care provider's suggestions for further testing if your chest pain does not go  away.  Keep any follow-up appointments you made. If you do not go to an appointment, you could develop lasting (chronic) problems with pain. If there is any problem keeping an appointment, call to reschedule. SEEK MEDICAL CARE IF:   Your chest pain does not go away, even after treatment.  You have a rash with blisters on your chest.  You have a fever. SEEK IMMEDIATE MEDICAL CARE IF:   You have increased chest pain or pain that spreads to your arm, neck, jaw, back, or abdomen.  You have shortness of breath.  You have an increasing cough, or you cough up blood.  You have severe back or abdominal pain.  You feel nauseous or vomit.  You have severe weakness.  You faint.  You have chills. This is an emergency. Do not wait to see if the pain will go away. Get medical help at once. Call your local emergency services (911 in [place] Do not drive yourself to the hospital. MAKE SURE YOU:   Understand these instructions.  Will watch your condition.  Will get help right away if you are not doing well or get worse. Document Released: 08/24/2005 Document Revised: 11/19/2013 Document Reviewed: 06/19/2008 [facility] Patient Information 2015 [place], [place]. This information is not intended to replace advice given to you by your health care provider. Make sure you discuss any questions you have with your health care provider.     Hyperglycemia Hyperglycemia occurs when the glucose (sugar) in your blood is too high. Hyperglycemia can happen for many reasons, but it most often happens to people who do not know they have diabetes or are not managing their diabetes properly.  CAUSES  Whether you have diabetes or not, there are other causes of hyperglycemia. Hyperglycemia can occur when you have diabetes, but it can also occur in other situations that you might not be as aware of, such as: Diabetes  If you have diabetes and are having problems controlling your blood glucose, hyperglycemia could  occur because of some of the following reasons:  Not following your meal plan.  Not taking your diabetes medications or not taking it properly.  Exercising less or doing less activity than you normally do.  Being sick. Pre-diabetes  This cannot be ignored. Before people develop Type 2 diabetes, they almost always have "pre-diabetes." This is when your blood glucose levels are higher than normal, but not yet high enough to be diagnosed as diabetes. Research has shown that some long-term damage to the body, especially the heart and circulatory system, may already be occurring during pre-diabetes. If you take action to manage your blood glucose when you have pre-diabetes, you may delay or prevent Type 2 diabetes from developing. Stress  If you have diabetes, you may be "diet" controlled or on oral medications or insulin to control your diabetes. However, you may find that your blood glucose is higher than usual in the hospital whether you have diabetes or not. This is often referred to as "stress hyperglycemia." Stress can elevate your blood glucose. This happens because of hormones put out by the body during times of stress. If stress has been the cause of your high blood  glucose, it can be followed regularly by your caregiver. That way he/she can make sure your hyperglycemia does not continue to get worse or progress to diabetes. Steroids  Steroids are medications that act on the infection fighting system (immune system) to block inflammation or infection. One side effect can be a rise in blood glucose. Most people can produce enough extra insulin to allow for this rise, but for those who cannot, steroids make blood glucose levels go even higher. It is not unusual for steroid treatments to "uncover" diabetes that is developing. It is not always possible to determine if the hyperglycemia will go away after the steroids are stopped. A special blood test called an A1c is sometimes done to determine if  your blood glucose was elevated before the steroids were started. SYMPTOMS  Thirsty.  Frequent urination.  Dry mouth.  Blurred vision.  Tired or fatigue.  Weakness.  Sleepy.  Tingling in feet or leg. DIAGNOSIS  Diagnosis is made by monitoring blood glucose in one or all of the following ways:  A1c test. This is a chemical found in your blood.  Fingerstick blood glucose monitoring.  Laboratory results. TREATMENT  First, knowing the cause of the hyperglycemia is important before the hyperglycemia can be treated. Treatment may include, but is not be limited to:  Education.  Change or adjustment in medications.  Change or adjustment in meal plan.  Treatment for an illness, infection, etc.  More frequent blood glucose monitoring.  Change in exercise plan.  Decreasing or stopping steroids.  Lifestyle changes. HOME CARE INSTRUCTIONS   Test your blood glucose as directed.  Exercise regularly. Your caregiver will give you instructions about exercise. Pre-diabetes or diabetes which comes on with stress is helped by exercising.  Eat wholesome, balanced meals. Eat often and at regular, fixed times. Your caregiver or nutritionist will give you a meal plan to guide your sugar intake.  Being at an ideal weight is important. If needed, losing as little as 10 to 15 pounds may help improve blood glucose levels. SEEK MEDICAL CARE IF:   You have questions about medicine, activity, or diet.  You continue to have symptoms (problems such as increased thirst, urination, or weight gain). SEEK IMMEDIATE MEDICAL CARE IF:   You are vomiting or have diarrhea.  Your breath smells fruity.  You are breathing faster or slower.  You are very sleepy or incoherent.  You have numbness, tingling, or pain in your feet or hands.  You have chest pain.  Your symptoms get worse even though you have been following your caregiver's orders.  If you have any other questions or  concerns. Document Released: 05/10/2001 Document Revised: 02/06/2012 Document Reviewed: 03/12/2012 [facility] Patient Information 2015 [place], [place]. This information is not intended to replace advice given to you by your health care provider. Make sure you discuss any questions you have with your health care provider.     Diabetes Mellitus and Food It is important for you to manage your blood sugar (glucose) level. Your blood glucose level can be greatly affected by what you eat. Eating healthier foods in the appropriate amounts throughout the day at about the same time each day will help you control your blood glucose level. It can also help slow or prevent worsening of your diabetes mellitus. Healthy eating may even help you improve the level of your blood pressure and reach or maintain a healthy weight.  HOW CAN FOOD AFFECT ME? Carbohydrates Carbohydrates affect your blood glucose level more than  any other type of food. Your dietitian will help you determine how many carbohydrates to eat at each meal and teach you how to count carbohydrates. Counting carbohydrates is important to keep your blood glucose at a healthy level, especially if you are using insulin or taking certain medicines for diabetes mellitus. Alcohol Alcohol can cause sudden decreases in blood glucose (hypoglycemia), especially if you use insulin or take certain medicines for diabetes mellitus. Hypoglycemia can be a life-threatening condition. Symptoms of hypoglycemia (sleepiness, dizziness, and disorientation) are similar to symptoms of having too much alcohol.  If your health care provider has given you approval to drink alcohol, do so in moderation and use the following guidelines:  Women should not have more than one drink per day, and men should not have more than two drinks per day. One drink is equal to:  12 oz of beer.  5 oz of wine.  1 oz of hard liquor.  Do not drink on an empty stomach.  Keep yourself  hydrated. Have water, diet soda, or unsweetened iced tea.  Regular soda, juice, and other mixers might contain a lot of carbohydrates and should be counted. WHAT FOODS ARE NOT RECOMMENDED? As you make food choices, it is important to remember that all foods are not the same. Some foods have fewer nutrients per serving than other foods, even though they might have the same number of calories or carbohydrates. It is difficult to get your body what it needs when you eat foods with fewer nutrients. Examples of foods that you should avoid that are high in calories and carbohydrates but low in nutrients include:  Trans fats (most processed foods list trans fats on the Nutrition Facts label).  Regular soda.  Juice.  Candy.  Sweets, such as cake, pie, doughnuts, and cookies.  Fried foods. WHAT FOODS CAN I EAT? Have nutrient-rich foods, which will nourish your body and keep you healthy. The food you should eat also will depend on several factors, including:  The calories you need.  The medicines you take.  Your weight.  Your blood glucose level.  Your blood pressure level.  Your cholesterol level. You also should eat a variety of foods, including:  Protein, such as meat, poultry, fish, tofu, nuts, and seeds (lean animal proteins are best).  Fruits.  Vegetables.  Dairy products, such as milk, cheese, and yogurt (low fat is best).  Breads, grains, pasta, cereal, rice, and beans.  Fats such as olive oil, trans fat-free margarine, canola oil, avocado, and olives. DOES EVERYONE WITH DIABETES MELLITUS HAVE THE SAME MEAL PLAN? Because every person with diabetes mellitus is different, there is not one meal plan that works for everyone. It is very important that you meet with a dietitian who will help you create a meal plan that is just right for you. Document Released: 08/11/2005 Document Revised: 11/19/2013 Document Reviewed: 10/11/2013 [facility] Patient Information 2015 [place],  [place]. This information is not intended to replace advice given to you by your health care provider. Make sure you discuss any questions you have with your health care provider.    Hypokalemia Hypokalemia means that the amount of potassium in the blood is lower than normal.Potassium is a chemical, called an electrolyte, that helps regulate the amount of fluid in the body. It also stimulates muscle contraction and helps nerves function properly.Most of the body's potassium is inside of cells, and only a very small amount is in the blood. Because the amount in the blood is so small,  minor changes can be life-threatening. CAUSES  Antibiotics.  Diarrhea or vomiting.  Using laxatives too much, which can cause diarrhea.  Chronic kidney disease.  Water pills (diuretics).  Eating disorders (bulimia).  Low magnesium level.  Sweating a lot. SIGNS AND SYMPTOMS  Weakness.  Constipation.  Fatigue.  Muscle cramps.  Mental confusion.  Skipped heartbeats or irregular heartbeat (palpitations).  Tingling or numbness. DIAGNOSIS  Your health care provider can diagnose hypokalemia with blood tests. In addition to checking your potassium level, your health care provider may also check other lab tests. TREATMENT Hypokalemia can be treated with potassium supplements taken by mouth or adjustments in your current medicines. If your potassium level is very low, you may need to get potassium through a vein (IV) and be monitored in the hospital. A diet high in potassium is also helpful. Foods high in potassium are:  Nuts, such as peanuts and pistachios.  Seeds, such as sunflower seeds and pumpkin seeds.  Peas, lentils, and lima beans.  Whole grain and bran cereals and breads.  Fresh fruit and vegetables, such as apricots, avocado, bananas, cantaloupe, kiwi, oranges, tomatoes, asparagus, and potatoes.  Orange and tomato juices.  Red meats.  Fruit yogurt. HOME CARE INSTRUCTIONS  Take all  medicines as prescribed by your health care provider.  Maintain a healthy diet by including nutritious food, such as fruits, vegetables, nuts, whole grains, and lean meats.  If you are taking a laxative, be sure to follow the directions on the label. SEEK MEDICAL CARE IF:  Your weakness gets worse.  You feel your heart pounding or racing.  You are vomiting or having diarrhea.  You are diabetic and having trouble keeping your blood glucose in the normal range. SEEK IMMEDIATE MEDICAL CARE IF:  You have chest pain, shortness of breath, or dizziness.  You are vomiting or having diarrhea for more than 2 days.  You faint. MAKE SURE YOU:   Understand these instructions.  Will watch your condition.  Will get help right away if you are not doing well or get worse. Document Released: 11/14/2005 Document Revised: 09/04/2013 Document Reviewed: 05/17/2013 [facility] Patient Information 2015 [place], [place]. This information is not intended to replace advice given to you by your health care provider. Make sure you discuss any questions you have with your health care provider.    Anemia, Nonspecific Anemia is a condition in which the concentration of red blood cells or hemoglobin in the blood is below normal. Hemoglobin is a substance in red blood cells that carries oxygen to the tissues of the body. Anemia results in not enough oxygen reaching these tissues.  CAUSES  Common causes of anemia include:   Excessive bleeding. Bleeding may be internal or external. This includes excessive bleeding from periods (in women) or from the intestine.   Poor nutrition.   Chronic kidney, thyroid, and liver disease.  Bone marrow disorders that decrease red blood cell production.  Cancer and treatments for cancer.  HIV, AIDS, and their treatments.  Spleen problems that increase red blood cell destruction.  Blood disorders.  Excess destruction of red blood cells due to infection, medicines, and  autoimmune disorders. SIGNS AND SYMPTOMS   Minor weakness.   Dizziness.   Headache.  Palpitations.   Shortness of breath, especially with exercise.   Paleness.  Cold sensitivity.  Indigestion.  Nausea.  Difficulty sleeping.  Difficulty concentrating. Symptoms may occur suddenly or they may develop slowly.  DIAGNOSIS  Additional blood tests are often needed. These help your  health care provider determine the best treatment. Your health care provider will check your stool for blood and look for other causes of blood loss.  TREATMENT  Treatment varies depending on the cause of the anemia. Treatment can include:   Supplements of iron, vitamin O29, or folic acid.   Hormone medicines.   A blood transfusion. This may be needed if blood loss is severe.   Hospitalization. This may be needed if there is significant continual blood loss.   Dietary changes.  Spleen removal. HOME CARE INSTRUCTIONS Keep all follow-up appointments. It often takes many weeks to correct anemia, and having your health care provider check on your condition and your response to treatment is very important. SEEK IMMEDIATE MEDICAL CARE IF:   You develop extreme weakness, shortness of breath, or chest pain.   You become dizzy or have trouble concentrating.  You develop heavy vaginal bleeding.   You develop a rash.   You have bloody or black, tarry stools.   You faint.   You vomit up blood.   You vomit repeatedly.   You have abdominal pain.  You have a fever or persistent symptoms for more than 2-3 days.   You have a fever and your symptoms suddenly get worse.   You are dehydrated.  MAKE SURE YOU:  Understand these instructions.  Will watch your condition.  Will get help right away if you are not doing well or get worse. Document Released: 12/22/2004 Document Revised: 07/17/2013 Document Reviewed: 05/10/2013 [facility] Patient Information 2015 [place], [place]. This  information is not intended to replace advice given to you by your health care provider. Make sure you discuss any questions you have with your health care provider.

## 2014-12-31 ENCOUNTER — Other Ambulatory Visit (HOSPITAL_COMMUNITY): Payer: Self-pay | Admitting: Cardiology

## 2014-12-31 DIAGNOSIS — R079 Chest pain, unspecified: Secondary | ICD-10-CM

## 2015-01-09 ENCOUNTER — Other Ambulatory Visit (HOSPITAL_COMMUNITY): Payer: Medicare Other

## 2015-01-09 ENCOUNTER — Other Ambulatory Visit: Payer: Self-pay

## 2015-01-09 ENCOUNTER — Ambulatory Visit (HOSPITAL_COMMUNITY): Payer: Medicare Other

## 2015-01-09 ENCOUNTER — Encounter (HOSPITAL_COMMUNITY)
Admission: RE | Admit: 2015-01-09 | Discharge: 2015-01-09 | Disposition: A | Discharge status: Home / Self Care | Payer: Medicare Other | Source: Ambulatory Visit | Attending: Cardiology | Admitting: Cardiology

## 2015-01-09 DIAGNOSIS — R079 Chest pain, unspecified: Secondary | ICD-10-CM | POA: Diagnosis not present

## 2015-01-09 MED ORDER — REGADENOSON 0.4 MG/5ML IV SOLN
INTRAVENOUS | Status: AC
  Filled 2015-01-09: qty 5

## 2015-01-09 MED ORDER — REGADENOSON 0.4 MG/5ML IV SOLN
0.4000 mg | Freq: Once | INTRAVENOUS | Status: AC
  Administered 2015-01-09: 0.4 mg via INTRAVENOUS

## 2015-01-09 MED ORDER — TECHNETIUM TC 99M SESTAMIBI GENERIC - CARDIOLITE
10.0000 | Freq: Once | INTRAVENOUS | Status: AC | PRN
  Administered 2015-01-09: 10 via INTRAVENOUS

## 2015-01-09 MED ORDER — TECHNETIUM TC 99M SESTAMIBI GENERIC - CARDIOLITE
30.0000 | Freq: Once | INTRAVENOUS | Status: AC | PRN
  Administered 2015-01-09: 30 via INTRAVENOUS

## 2015-05-25 ENCOUNTER — Other Ambulatory Visit: Payer: Self-pay

## 2016-02-10 ENCOUNTER — Encounter (INDEPENDENT_AMBULATORY_CARE_PROVIDER_SITE_OTHER): Payer: Medicare Other | Admitting: Ophthalmology

## 2016-02-10 DIAGNOSIS — H43813 Vitreous degeneration, bilateral: Secondary | ICD-10-CM | POA: Diagnosis not present

## 2016-02-10 DIAGNOSIS — H35033 Hypertensive retinopathy, bilateral: Secondary | ICD-10-CM | POA: Diagnosis not present

## 2016-02-10 DIAGNOSIS — I10 Essential (primary) hypertension: Secondary | ICD-10-CM

## 2016-02-10 DIAGNOSIS — D3131 Benign neoplasm of right choroid: Secondary | ICD-10-CM

## 2016-02-10 DIAGNOSIS — H35372 Puckering of macula, left eye: Secondary | ICD-10-CM | POA: Diagnosis not present

## 2016-03-18 ENCOUNTER — Other Ambulatory Visit: Payer: Self-pay | Admitting: Internal Medicine

## 2016-03-18 DIAGNOSIS — R748 Abnormal levels of other serum enzymes: Secondary | ICD-10-CM

## 2016-04-06 ENCOUNTER — Ambulatory Visit
Admission: RE | Admit: 2016-04-06 | Discharge: 2016-04-06 | Disposition: A | Discharge status: Home / Self Care | Payer: Medicare Other | Source: Ambulatory Visit | Attending: Internal Medicine | Admitting: Internal Medicine

## 2016-04-06 DIAGNOSIS — R748 Abnormal levels of other serum enzymes: Secondary | ICD-10-CM

## 2016-04-23 ENCOUNTER — Emergency Department (HOSPITAL_COMMUNITY): Payer: Medicare Other

## 2016-04-23 ENCOUNTER — Observation Stay (HOSPITAL_COMMUNITY)
Admission: EM | Admit: 2016-04-23 | Discharge: 2016-04-27 | Disposition: A | Discharge status: Home / Self Care | Payer: Medicare Other | Attending: Internal Medicine | Admitting: Internal Medicine

## 2016-04-23 ENCOUNTER — Encounter (HOSPITAL_COMMUNITY): Payer: Self-pay

## 2016-04-23 DIAGNOSIS — T797XXA Traumatic subcutaneous emphysema, initial encounter: Secondary | ICD-10-CM | POA: Diagnosis not present

## 2016-04-23 DIAGNOSIS — Z8249 Family history of ischemic heart disease and other diseases of the circulatory system: Secondary | ICD-10-CM | POA: Insufficient documentation

## 2016-04-23 DIAGNOSIS — Z9049 Acquired absence of other specified parts of digestive tract: Secondary | ICD-10-CM | POA: Insufficient documentation

## 2016-04-23 DIAGNOSIS — S0003XA Contusion of scalp, initial encounter: Secondary | ICD-10-CM | POA: Diagnosis not present

## 2016-04-23 DIAGNOSIS — R0609 Other forms of dyspnea: Secondary | ICD-10-CM | POA: Insufficient documentation

## 2016-04-23 DIAGNOSIS — M4802 Spinal stenosis, cervical region: Secondary | ICD-10-CM | POA: Diagnosis not present

## 2016-04-23 DIAGNOSIS — D509 Iron deficiency anemia, unspecified: Secondary | ICD-10-CM | POA: Diagnosis present

## 2016-04-23 DIAGNOSIS — Z7984 Long term (current) use of oral hypoglycemic drugs: Secondary | ICD-10-CM | POA: Insufficient documentation

## 2016-04-23 DIAGNOSIS — R531 Weakness: Secondary | ICD-10-CM | POA: Diagnosis not present

## 2016-04-23 DIAGNOSIS — M50323 Other cervical disc degeneration at C6-C7 level: Secondary | ICD-10-CM | POA: Diagnosis not present

## 2016-04-23 DIAGNOSIS — I491 Atrial premature depolarization: Secondary | ICD-10-CM | POA: Insufficient documentation

## 2016-04-23 DIAGNOSIS — R262 Difficulty in walking, not elsewhere classified: Secondary | ICD-10-CM | POA: Insufficient documentation

## 2016-04-23 DIAGNOSIS — Z79899 Other long term (current) drug therapy: Secondary | ICD-10-CM | POA: Insufficient documentation

## 2016-04-23 DIAGNOSIS — A419 Sepsis, unspecified organism: Secondary | ICD-10-CM

## 2016-04-23 DIAGNOSIS — I672 Cerebral atherosclerosis: Secondary | ICD-10-CM | POA: Insufficient documentation

## 2016-04-23 DIAGNOSIS — S0101XA Laceration without foreign body of scalp, initial encounter: Secondary | ICD-10-CM

## 2016-04-23 DIAGNOSIS — J45909 Unspecified asthma, uncomplicated: Secondary | ICD-10-CM | POA: Insufficient documentation

## 2016-04-23 DIAGNOSIS — Z833 Family history of diabetes mellitus: Secondary | ICD-10-CM | POA: Diagnosis not present

## 2016-04-23 DIAGNOSIS — N390 Urinary tract infection, site not specified: Principal | ICD-10-CM | POA: Insufficient documentation

## 2016-04-23 DIAGNOSIS — E890 Postprocedural hypothyroidism: Secondary | ICD-10-CM | POA: Diagnosis not present

## 2016-04-23 DIAGNOSIS — M50322 Other cervical disc degeneration at C5-C6 level: Secondary | ICD-10-CM | POA: Insufficient documentation

## 2016-04-23 DIAGNOSIS — E872 Acidosis: Secondary | ICD-10-CM | POA: Diagnosis not present

## 2016-04-23 DIAGNOSIS — E119 Type 2 diabetes mellitus without complications: Secondary | ICD-10-CM | POA: Insufficient documentation

## 2016-04-23 DIAGNOSIS — E871 Hypo-osmolality and hyponatremia: Secondary | ICD-10-CM | POA: Diagnosis not present

## 2016-04-23 DIAGNOSIS — E876 Hypokalemia: Secondary | ICD-10-CM | POA: Insufficient documentation

## 2016-04-23 DIAGNOSIS — M50321 Other cervical disc degeneration at C4-C5 level: Secondary | ICD-10-CM | POA: Insufficient documentation

## 2016-04-23 DIAGNOSIS — X58XXXA Exposure to other specified factors, initial encounter: Secondary | ICD-10-CM | POA: Diagnosis not present

## 2016-04-23 DIAGNOSIS — A599 Trichomoniasis, unspecified: Secondary | ICD-10-CM | POA: Insufficient documentation

## 2016-04-23 DIAGNOSIS — Z7982 Long term (current) use of aspirin: Secondary | ICD-10-CM | POA: Insufficient documentation

## 2016-04-23 DIAGNOSIS — W19XXXA Unspecified fall, initial encounter: Secondary | ICD-10-CM

## 2016-04-23 LAB — CBC
HCT: 22.4 % — ABNORMAL LOW (ref 36.0–46.0)
Hemoglobin: 7 g/dL — ABNORMAL LOW (ref 12.0–15.0)
MCH: 24.7 pg — AB (ref 26.0–34.0)
MCHC: 31.3 g/dL (ref 30.0–36.0)
MCV: 79.2 fL (ref 78.0–100.0)
PLATELETS: 416 10*3/uL — AB (ref 150–400)
RBC: 2.83 MIL/uL — ABNORMAL LOW (ref 3.87–5.11)
RDW: 14.9 % (ref 11.5–15.5)
WBC: 10.5 10*3/uL (ref 4.0–10.5)

## 2016-04-23 LAB — BASIC METABOLIC PANEL
ANION GAP: 12 (ref 5–15)
BUN: 17 mg/dL (ref 6–20)
CO2: 22 mmol/L (ref 22–32)
Calcium: 7.6 mg/dL — ABNORMAL LOW (ref 8.9–10.3)
Chloride: 95 mmol/L — ABNORMAL LOW (ref 101–111)
Creatinine, Ser: 0.99 mg/dL (ref 0.44–1.00)
GFR calc non Af Amer: 50 mL/min — ABNORMAL LOW (ref >60)
GFR, EST AFRICAN AMERICAN: 58 mL/min — AB (ref >60)
Glucose, Bld: 231 mg/dL — ABNORMAL HIGH (ref 65–99)
POTASSIUM: 2.7 mmol/L — AB (ref 3.5–5.1)
SODIUM: 129 mmol/L — AB (ref 135–145)

## 2016-04-23 LAB — URINALYSIS, ROUTINE W REFLEX MICROSCOPIC
Glucose, UA: NEGATIVE mg/dL
Ketones, ur: NEGATIVE mg/dL
Nitrite: NEGATIVE
PH: 6 (ref 5.0–8.0)
Protein, ur: 30 mg/dL — AB
SPECIFIC GRAVITY, URINE: 1.018 (ref 1.005–1.030)

## 2016-04-23 LAB — GLUCOSE, CAPILLARY: Glucose-Capillary: 178 mg/dL — ABNORMAL HIGH (ref 65–99)

## 2016-04-23 LAB — I-STAT CG4 LACTIC ACID, ED
Lactic Acid, Venous: 5.37 mmol/L (ref 0.5–2.0)
Lactic Acid, Venous: 4.73 mmol/L (ref 0.5–2.0)

## 2016-04-23 LAB — URINE MICROSCOPIC-ADD ON

## 2016-04-23 MED ORDER — PIPERACILLIN-TAZOBACTAM 3.375 G IVPB 30 MIN
3.3750 g | Freq: Once | INTRAVENOUS | Status: AC
  Administered 2016-04-23: 3.375 g via INTRAVENOUS
  Filled 2016-04-23: qty 50

## 2016-04-23 MED ORDER — SODIUM CHLORIDE 0.9 % IV BOLUS (SEPSIS)
1000.0000 mL | Freq: Once | INTRAVENOUS | Status: AC
  Administered 2016-04-23: 1000 mL via INTRAVENOUS

## 2016-04-23 MED ORDER — POTASSIUM CHLORIDE 10 MEQ/100ML IV SOLN
10.0000 meq | Freq: Once | INTRAVENOUS | Status: AC
  Administered 2016-04-23: 10 meq via INTRAVENOUS
  Filled 2016-04-23: qty 100

## 2016-04-23 MED ORDER — INSULIN ASPART 100 UNIT/ML ~~LOC~~ SOLN: 0.0000 [IU] | Freq: Every day | SUBCUTANEOUS | Status: DC

## 2016-04-23 MED ORDER — SODIUM CHLORIDE 0.9% FLUSH
3.0000 mL | Freq: Two times a day (BID) | INTRAVENOUS | Status: DC
  Administered 2016-04-24 – 2016-04-27 (×7): 3 mL via INTRAVENOUS

## 2016-04-23 MED ORDER — DEXTROSE 5 % IV SOLN: 2.0000 g | Freq: Once | INTRAVENOUS | Status: DC

## 2016-04-23 MED ORDER — FERROUS SULFATE 325 (65 FE) MG PO TABS
325.0000 mg | ORAL_TABLET | Freq: Every day | ORAL | Status: DC
  Administered 2016-04-24 – 2016-04-27 (×4): 325 mg via ORAL
  Filled 2016-04-23 (×4): qty 1

## 2016-04-23 MED ORDER — VANCOMYCIN HCL IN DEXTROSE 1-5 GM/200ML-% IV SOLN
1000.0000 mg | Freq: Once | INTRAVENOUS | Status: AC
  Administered 2016-04-23: 1000 mg via INTRAVENOUS
  Filled 2016-04-23: qty 200

## 2016-04-23 MED ORDER — POTASSIUM CHLORIDE CRYS ER 20 MEQ PO TBCR
40.0000 meq | EXTENDED_RELEASE_TABLET | Freq: Once | ORAL | Status: AC
  Administered 2016-04-23: 40 meq via ORAL
  Filled 2016-04-23: qty 2

## 2016-04-23 MED ORDER — TETANUS-DIPHTH-ACELL PERTUSSIS 5-2.5-18.5 LF-MCG/0.5 IM SUSP
0.5000 mL | Freq: Once | INTRAMUSCULAR | Status: AC
  Administered 2016-04-23: 0.5 mL via INTRAMUSCULAR
  Filled 2016-04-23: qty 0.5

## 2016-04-23 MED ORDER — ONDANSETRON HCL 4 MG/2ML IJ SOLN
4.0000 mg | Freq: Four times a day (QID) | INTRAMUSCULAR | Status: DC | PRN
Start: 2016-04-23 — End: 2016-04-27

## 2016-04-23 MED ORDER — CEFTRIAXONE SODIUM 1 G IJ SOLR
1.0000 g | INTRAMUSCULAR | Status: DC
  Administered 2016-04-23 – 2016-04-26 (×4): 1 g via INTRAVENOUS
  Filled 2016-04-23 (×4): qty 10

## 2016-04-23 MED ORDER — ENOXAPARIN SODIUM 40 MG/0.4ML ~~LOC~~ SOLN
40.0000 mg | Freq: Every day | SUBCUTANEOUS | Status: DC
  Administered 2016-04-23: 40 mg via SUBCUTANEOUS
  Filled 2016-04-23: qty 0.4

## 2016-04-23 MED ORDER — LEVOTHYROXINE SODIUM 112 MCG PO TABS
112.0000 ug | ORAL_TABLET | Freq: Every day | ORAL | Status: DC
  Administered 2016-04-24 – 2016-04-27 (×4): 112 ug via ORAL
  Filled 2016-04-23 (×4): qty 1

## 2016-04-23 MED ORDER — POTASSIUM CHLORIDE IN NACL 40-0.9 MEQ/L-% IV SOLN
INTRAVENOUS | Status: DC
  Administered 2016-04-24 – 2016-04-25 (×3): 75 mL/h via INTRAVENOUS
  Filled 2016-04-23 (×5): qty 1000

## 2016-04-23 MED ORDER — ACETAMINOPHEN 325 MG PO TABS: 650.0000 mg | ORAL_TABLET | Freq: Four times a day (QID) | ORAL | Status: DC | PRN

## 2016-04-23 MED ORDER — ONDANSETRON HCL 4 MG PO TABS: 4.0000 mg | ORAL_TABLET | Freq: Four times a day (QID) | ORAL | Status: DC | PRN

## 2016-04-23 MED ORDER — ACETAMINOPHEN 650 MG RE SUPP: 650.0000 mg | Freq: Four times a day (QID) | RECTAL | Status: DC | PRN

## 2016-04-23 MED ORDER — INSULIN ASPART 100 UNIT/ML ~~LOC~~ SOLN
0.0000 [IU] | Freq: Three times a day (TID) | SUBCUTANEOUS | Status: DC
  Administered 2016-04-24 – 2016-04-26 (×2): 1 [IU] via SUBCUTANEOUS
  Administered 2016-04-27: 2 [IU] via SUBCUTANEOUS

## 2016-04-23 MED ORDER — METRONIDAZOLE 500 MG PO TABS
2000.0000 mg | ORAL_TABLET | Freq: Once | ORAL | Status: AC
  Administered 2016-04-23: 2000 mg via ORAL
  Filled 2016-04-23: qty 4

## 2016-04-23 MED ORDER — ASPIRIN EC 81 MG PO TBEC
81.0000 mg | DELAYED_RELEASE_TABLET | Freq: Every evening | ORAL | Status: DC
  Administered 2016-04-23 – 2016-04-26 (×4): 81 mg via ORAL
  Filled 2016-04-23 (×4): qty 1

## 2016-04-23 MED ORDER — PIPERACILLIN-TAZOBACTAM 3.375 G IVPB: 3.3750 g | Freq: Three times a day (TID) | INTRAVENOUS | Status: DC

## 2016-04-23 MED ORDER — VANCOMYCIN HCL IN DEXTROSE 750-5 MG/150ML-% IV SOLN: 750.0000 mg | INTRAVENOUS | Status: DC

## 2016-04-23 NOTE — ED Notes (Signed)
K level 2.7, called by lab to this RN, Dr Rex Kras notified.

## 2016-04-23 NOTE — Progress Notes (Signed)
Pt arrived from ED to [facility] at 2140. VS's stable- slightly tachycardic @ 105 with a low grade temp 99.3, no complaints of pain, AOx4.  Family is at the bedside. RN will continue to monitor.

## 2016-04-23 NOTE — Progress Notes (Signed)
Ceftriaxone for UTI per pharmacy ordered.  Ceftriaxone does not need renal adjustment.  [employer] policy allows pharmacy to change the ordered dose based on indication without contacting the physician, therefore a consult is not needed.   Plan: -ceftriaxone 1 g IV q24h -pharmacy to sign off as no adjustment needed   Harvel Quale 04/23/2016 9:39 PM

## 2016-04-23 NOTE — H&P (Signed)
History and Physical  Patient Name: [name]     [id number]    DOB: Jul 01, 1928    DOA: 04/23/2016 PCP: Charolette Forward, MD   Patient coming from: Home  Chief Complaint: Weakness and fall  HPI: [name] is a 82 y.o. female with a past medical history significant for NIDDM, HTN, hypothyroidism who presents with weakness for 1 week and dysuria.  The patient lives with her grandson, has no dementia and is functional and mostly independent at baseline.  She and family report ongoing weightloss of ~45 lbs over the last 12 years, despite using Boost supplements and peanut butter.  She has had an unspecified workup by her PCP.  However, she presents today because in the last week, she has had increasing weakness, fatigue.  Today she had some dysuria, similar to one month ago when she had a UTI treated by her PCP.  Then this afternoon she was walking on grass, tripped and struck the back of her head on cement which bled, came to the ER.  No fever, chills, nausea, flank pain.  ED course: -Low grade temperature, tachycardic to 120s, tachypneic, BP stable, saturating well on ambient air -CT head negative, and received one staple to head -Na 129 (baseline), K 2.7, Cr 1.0 (baseline 0.8-1.2), WBC 10.5K, Hgb 7.0  (previous 9 g/dL) and microcytic -Lactic acid was 5.37 and UA showed mild pyuria, bacteria and trichomonas actually  -30 cc/kg was given, blood and urine cultures were obtained, and vancomycin and Zosyn were administered for UTI sepsis and TRH were asked to evaluate for admission.   With regard to weight loss and anemia, she has noted no breast masses, no change in bowel habits or melena, hematochezia or mucus-y stools, no abdominal pain, chest pain, cough, sputum, hemoptysis.  With regard to trichomonas, she denies being sexually active.    Review of Systems:  Pt complains of weakness, weight loss, foul smelling concentrated dark urine, dysuria. Pt denies any fever,  confusion.  All other systems negative except as just noted or noted in the history of present illness.    Past Medical History  Diagnosis Date  . Asthma   . Diabetes mellitus without complication ([place])   . Hypertension   . Thyroid disease     Past Surgical History  Procedure Laterality Date  . Appendectomy    . Cholecystectomy    . Thyroid surgery      Social History: Patient lives with her grandson.  The patient walks without a cane and requires no assistance with IADLs or ADLs.  No dementia.  Nonsmoker.  No Known Allergies  Family history: family history includes Diabetes in her maternal grandmother; Hypertension in her paternal grandmother. There is no history of Cancer.  Prior to Admission medications   Medication Sig Start Date End Date Taking? Authorizing Provider  Amlodipine-Valsartan-HCTZ (EXFORGE HCT) 5-160-12.5 MG TABS Take 1 tablet by mouth daily.   Yes Historical Provider, MD  aspirin EC 81 MG tablet Take 81 mg by mouth every evening.    Yes Historical Provider, MD  ferrous sulfate 325 (65 FE) MG tablet Take 325 mg by mouth daily. 04/12/16  Yes Historical Provider, MD  levothyroxine (SYNTHROID, LEVOTHROID) 112 MCG tablet Take 112 mcg by mouth daily before breakfast.   Yes Historical Provider, MD  metFORMIN (GLUCOPHAGE) 500 MG tablet Take 500 mg by mouth 2 (two) times daily with a meal.   Yes Historical Provider, MD  metoprolol succinate (TOPROL-XL) 50 MG 24 hr tablet  Take 50 mg by mouth daily. Take with or immediately following a meal.   Yes Historical Provider, MD  albuterol (PROVENTIL HFA;VENTOLIN HFA) 108 (90 BASE) MCG/ACT inhaler Inhale 1-2 puffs into the lungs every 6 (six) hours as needed for wheezing. 06/26/13   Randa Spike, MD       Physical Exam: BP 139/83 mmHg  Pulse 108  Temp(Src) 99.3 F (37.4 C) (Oral)  Resp 22  Ht 4\' 11"  (1.499 m)  Wt 56.756 kg (125 lb 2 oz)  BMI 25.26 kg/m2  SpO2 100% General appearance: Frail elderly adult female,  alert and in no acute distress.   Eyes: Anicteric, conjunctiva pink, lids and lashes normal.     ENT: No nasal deformity, discharge, or epistaxis.  OP moist without lesions.  Edentulous, no neck masses appreciated. Lymph: No cervical or supraclavicular lymphadenopathy. Skin: Warm and dry.  No jaundice.  Some red, eczematous rash on right abdomen, no evidence of cellulitis on lower extremities, abdomen, groin. Cardiac: Tachycardic, regular, nl S1-S2, 1/6 SEM.  Capillary refill is brisk.  Moderate non-pitting symmetric LE edema.  Radial and DP pulses 2+ and symmetric. Respiratory: Normal respiratory rate and rhythm.  CTAB without rales or wheezes. Abdomen: Abdomen soft without rigidity.  No TTP or guarding. No ascites, distension.   MSK: No deformities or effusions. Neuro: Oriented to person, place, time.  Cranial nerves normal.  Sensorium intact and responding to questions, attention normal.  Speech is fluent.  Moves all extremities equally and with normal coordination.    Psych: Behavior appropriate.  Affect pleasant, joking.  No evidence of aural or visual hallucinations or delusions.       Labs on Admission:  I have personally reviewed following labs and imaging studies: CBC:  Recent Labs Lab 04/23/16 1649  WBC 10.5  HGB 7.0*  HCT 22.4*  MCV 79.2  PLT 123456*   Basic Metabolic Panel:  Recent Labs Lab 04/23/16 1649  NA 129*  K 2.7*  CL 95*  CO2 22  GLUCOSE 231*  BUN 17  CREATININE 0.99  CALCIUM 7.6*   GFR: Estimated Creatinine Clearance: 31.3 mL/min (by C-G formula based on Cr of 0.99). Liver Function Tests: No results for input(s): AST, ALT, ALKPHOS, BILITOT, PROT, ALBUMIN in the last 168 hours. No results for input(s): LIPASE, AMYLASE in the last 168 hours. No results for input(s): AMMONIA in the last 168 hours. Coagulation Profile: No results for input(s): INR, PROTIME in the last 168 hours. Cardiac Enzymes: No results for input(s): CKTOTAL, CKMB, CKMBINDEX,  TROPONINI in the last 168 hours. BNP (last 3 results) No results for input(s): PROBNP in the last 8760 hours. HbA1C: No results for input(s): HGBA1C in the last 72 hours. CBG: No results for input(s): GLUCAP in the last 168 hours. Lipid Profile: No results for input(s): CHOL, HDL, LDLCALC, TRIG, CHOLHDL, LDLDIRECT in the last 72 hours. Thyroid Function Tests: No results for input(s): TSH, T4TOTAL, FREET4, T3FREE, THYROIDAB in the last 72 hours. Anemia Panel: No results for input(s): VITAMINB12, FOLATE, FERRITIN, TIBC, IRON, RETICCTPCT in the last 72 hours. Sepsis Labs: Lactic acid 5.37 --> 4.7 @LABRCNTIP (procalcitonin:4,lacticidven:4) )No results found for this or any previous visit (from the past 240 hour(s)).       Radiological Exams on Admission: Personally reviewed: Dg Chest 2 View  04/23/2016  CLINICAL DATA:  Status post fall backward, hitting head on concrete. Concern for chest injury. Initial encounter. EXAM: CHEST  2 VIEW COMPARISON:  Chest radiograph performed 12/28/2014 FINDINGS: The lungs are well-aerated.  Pulmonary vascularity is at the upper limits of normal. Minimal right midlung atelectasis is noted. There is no evidence of pleural effusion or pneumothorax. The heart is normal in size; the mediastinal contour is within normal limits. No acute osseous abnormalities are seen. IMPRESSION: Minimal right midlung atelectasis noted. Lungs otherwise clear. No displaced rib fractures identified. Electronically Signed   By: Garald Balding M.D.   On: 04/23/2016 18:35   Ct Head Wo Contrast  04/23/2016  CLINICAL DATA:  Syncope.  Fall. EXAM: CT HEAD WITHOUT CONTRAST CT CERVICAL SPINE WITHOUT CONTRAST TECHNIQUE: Multidetector CT imaging of the head and cervical spine was performed following the standard protocol without intravenous contrast. Multiplanar CT image reconstructions of the cervical spine were also generated. COMPARISON:  None. FINDINGS: CT HEAD FINDINGS There is a moderate left  parietal scalp contusion with associated subcutaneous emphysema. No evidence of parenchymal hemorrhage or extra-axial fluid collection. No mass lesion, mass effect, or midline shift. No CT evidence of acute infarction. Intracranial atherosclerosis. Nonspecific moderate subcortical and periventricular white matter hypodensity, most in keeping with chronic small vessel ischemic change. Generalized cerebral volume loss. No ventriculomegaly. The visualized paranasal sinuses are essentially clear. The mastoid air cells are unopacified. No evidence of calvarial fracture. CT CERVICAL SPINE FINDINGS No fracture is detected in the cervical spine. No prevertebral soft tissue swelling. There is straightening of the cervical spine, usually due to positioning and/or muscle spasm. Dens is well positioned between the lateral masses of C1. The lateral masses appear well-aligned. Moderate degenerative disc disease in the lower cervical spine, most prominent at C6-7. Moderate bilateral facet arthropathy. Mild degenerative foraminal stenosis on the right at C5-6 and bilaterally at C6-7. Minimal 2 mm anterolisthesis at C4-5, C5-6 and C7-T1. Visualized mastoid air cells appear clear. No evidence of intra-axial hemorrhage in the visualized brain. No gross cervical canal hematoma. No significant pulmonary nodules at the visualized lung apices. Total thyroidectomy. No cervical adenopathy or other significant neck soft tissue abnormality. IMPRESSION: 1. Moderate left parietal scalp contusion with associated subcutaneous emphysema suggesting associated scalp laceration. 2. No evidence of acute intracranial abnormality. No evidence of calvarial fracture. 3. No cervical spine fracture. 4. Moderate degenerative changes in the cervical spine as described. Minimal multilevel cervical spondylolisthesis, probably degenerative. Electronically Signed   By: Ilona Sorrel M.D.   On: 04/23/2016 19:06   Ct Cervical Spine Wo Contrast  04/23/2016   CLINICAL DATA:  Syncope.  Fall. EXAM: CT HEAD WITHOUT CONTRAST CT CERVICAL SPINE WITHOUT CONTRAST TECHNIQUE: Multidetector CT imaging of the head and cervical spine was performed following the standard protocol without intravenous contrast. Multiplanar CT image reconstructions of the cervical spine were also generated. COMPARISON:  None. FINDINGS: CT HEAD FINDINGS There is a moderate left parietal scalp contusion with associated subcutaneous emphysema. No evidence of parenchymal hemorrhage or extra-axial fluid collection. No mass lesion, mass effect, or midline shift. No CT evidence of acute infarction. Intracranial atherosclerosis. Nonspecific moderate subcortical and periventricular white matter hypodensity, most in keeping with chronic small vessel ischemic change. Generalized cerebral volume loss. No ventriculomegaly. The visualized paranasal sinuses are essentially clear. The mastoid air cells are unopacified. No evidence of calvarial fracture. CT CERVICAL SPINE FINDINGS No fracture is detected in the cervical spine. No prevertebral soft tissue swelling. There is straightening of the cervical spine, usually due to positioning and/or muscle spasm. Dens is well positioned between the lateral masses of C1. The lateral masses appear well-aligned. Moderate degenerative disc disease in the lower cervical spine, most prominent at  C6-7. Moderate bilateral facet arthropathy. Mild degenerative foraminal stenosis on the right at C5-6 and bilaterally at C6-7. Minimal 2 mm anterolisthesis at C4-5, C5-6 and C7-T1. Visualized mastoid air cells appear clear. No evidence of intra-axial hemorrhage in the visualized brain. No gross cervical canal hematoma. No significant pulmonary nodules at the visualized lung apices. Total thyroidectomy. No cervical adenopathy or other significant neck soft tissue abnormality. IMPRESSION: 1. Moderate left parietal scalp contusion with associated subcutaneous emphysema suggesting associated  scalp laceration. 2. No evidence of acute intracranial abnormality. No evidence of calvarial fracture. 3. No cervical spine fracture. 4. Moderate degenerative changes in the cervical spine as described. Minimal multilevel cervical spondylolisthesis, probably degenerative. Electronically Signed   By: Ilona Sorrel M.D.   On: 04/23/2016 19:06    EKG: Independently reviewed. Rate 129, sinus rhythm.  QTc 353.  No ischemic changes.    Assessment/Plan 1. Sepsis from UTI:  Suspected source urine. Organism unknown. Patient meets criteria given tachycardia, tachypnea, and evidence of organ dysfunction.  Lactate 5.37 mmol/L and repeat ordered within 6 hours.  This patient is not at high risk of poor outcomes with a SOFA score of 1.  Antibiotics delivered in the ED.    Alternative to sepsis, it is possible that she has elevated lactic acid because of her degree of chronic worsening anemia as well as metformin use.    -Sepsis bundle utilized:  -Blood and urine cultures drawn  -30 ml/kg bolus given in ED, will repeat lactic acid  -Start targeted antibiotics with ceftriaxone, based on suspected source of infection    -Repeat renal function and complete blood count at midnight   -Code SEPSIS called to [place]      2. Hyponatremia:  Likely hypovolemic. -Fluids and repeat BMP at midnight  3. Hypokalemia:  -Check magnesium -Supplement K -Recheck at midnight  4. Anemia, microcytic:  Last colonoscopy at age 37, told not to need one again. -Repeat Hgb at midnight, transfuse if <7 g/dL -Continue home iron -Type and screen now, risks and benefits of transfusion discussed  5. Hypocalcemia:  -Check ionized CA  6. Trichomonas:  Seems implausible that pt has STD, but can't rule out true positive -Metronidazole oral 2 g once -Zofran PRN  7. HTN:  -Hold Exforge and metoprolol while BP soft  8. Diabetes, NIDDM: -Hold metformin given lactic acidosis -Sliding scale corrections  9. Hypothyroidism,  post-surgical: -Continue levothyroxine      DVT prophylaxis: Lovenox  Code Status: DO NOT RESUSCITATE  Family Communication: Grandson and family at bedside; CODE STATUS confirmed, diagnosis and expected overnight treatments discussed Disposition Plan: Anticipate IV fluids and antibiotics. Close monitoring of multiple electrolyte abnormalities and repeat Hgb at midnight, possible transfusion Consults called: None Admission status: Inpatient, stepdown   Medical decision making: Patient seen at 8:34 PM on 04/23/2016.  The patient was discussed with Lorre Munroe, PA-C and Dr. Melvyn Novas from [facility], the latter of whom was not asked to evaluate the patient in person, but who was in general agreement with the plan of care and was available for consult if needed. What exists of the patient's chart was reviewed in depth.  Clinical condition: requiring ongoing fluids, close monitoring of electrolytes and repeat lactic acid, overall guarded prognosis, hemodynamically stable at present and not requiring supplemental O2.        Edwin Dada [facility] Pager [phone]

## 2016-04-23 NOTE — ED Notes (Signed)
Pt received all of 500 bolos of NS that EMS started.

## 2016-04-23 NOTE — ED Notes (Signed)
To room via EMS.  Pt walking in grass and fell backwards hitting back of head on concrete.  EMS has wound to back of head, bleeding controlled.  Pt is A&Ox4.  C collar applied by [facility]

## 2016-04-23 NOTE — ED Provider Notes (Signed)
CSN: [id number]     Arrival date & time 04/23/16  1615 History   First MD Initiated Contact with Patient 04/23/16 1625     No chief complaint on file.    (Consider location/radiation/quality/duration/timing/severity/associated sxs/prior Treatment) HPI Comments: Patient presents to the emergency department with chief complaint of fall and head injury. She states that she was walking through the grass, and fell backward, striking her head on the cement. She did not pass out. She complains of mild to moderate headache. She denies any neck pain. Denies any chest pain, shortness breath, or abdominal pain. Denies any pain in her extremities. There are no modifying factors.   Additionally, patient states that she has felt slightly fatigued, and has had some dysuria.  The history is provided by the patient. No language interpreter was used.    Past Medical History  Diagnosis Date  . Asthma   . Diabetes mellitus without complication   . Hypertension   . Thyroid disease    Past Surgical History  Procedure Laterality Date  . Appendectomy    . Cholecystectomy    . Thyroid surgery     No family history on file. Social History  Substance Use Topics  . Smoking status: Never Smoker   . Smokeless tobacco: Not on file  . Alcohol Use: No   OB History    No data available     Review of Systems  Constitutional: Negative for fever and chills.  Respiratory: Negative for shortness of breath.   Cardiovascular: Negative for chest pain.  Gastrointestinal: Negative for nausea, vomiting, diarrhea and constipation.  Genitourinary: Negative for dysuria.  Skin: Positive for wound.  All other systems reviewed and are negative.     Allergies  Review of patient's allergies indicates no known allergies.  Home Medications   Prior to Admission medications   Medication Sig Start Date End Date Taking? Authorizing Provider  albuterol (PROVENTIL HFA;VENTOLIN HFA) 108 (90 BASE) MCG/ACT inhaler Inhale  1-2 puffs into the lungs every 6 (six) hours as needed for wheezing. 06/26/13   Adlih Moreno-Coll, MD  amLODipine-valsartan (EXFORGE) 5-160 MG per tablet Take 1 tablet by mouth daily.    Historical Provider, MD  benzonatate (TESSALON) 100 MG capsule Take 1 capsule (100 mg total) by mouth every 8 (eight) hours. 06/26/13   Adlih Moreno-Coll, MD  doxycycline (VIBRAMYCIN) 100 MG capsule Take 1 capsule (100 mg total) by mouth 2 (two) times daily. 06/26/13   Adlih Moreno-Coll, MD  levothyroxine (SYNTHROID, LEVOTHROID) 112 MCG tablet Take 112 mcg by mouth daily before breakfast.    Historical Provider, MD  metFORMIN (GLUCOPHAGE) 500 MG tablet Take 500 mg by mouth 2 (two) times daily with a meal.    Historical Provider, MD  predniSONE (DELTASONE) 10 MG tablet Take  4tabs po on day #1 then          3 tabs po on day#2         2 tbas po on day#3         1 tab po on  day#4       1/2 tab po on day#5 06/26/13   Adlih Moreno-Coll, MD  rosuvastatin (CRESTOR) 5 MG tablet Take 5 mg by mouth daily.    Historical Provider, MD   There were no vitals taken for this visit. Physical Exam  Constitutional: She is oriented to person, place, and time. She appears well-developed and well-nourished.  HENT:  Head: Normocephalic and atraumatic.  Eyes: Conjunctivae and EOM are normal. Pupils  are equal, round, and reactive to light.  Neck: Normal range of motion. Neck supple.  Cardiovascular: Regular rhythm.  Exam reveals no gallop and no friction rub.   No murmur heard. Tachycardic  Pulmonary/Chest: Effort normal and breath sounds normal. No respiratory distress. She has no wheezes. She has no rales. She exhibits no tenderness.  Abdominal: Soft. Bowel sounds are normal. She exhibits no distension and no mass. There is no tenderness. There is no rebound and no guarding.  No focal abdominal tenderness, no RLQ tenderness or pain at McBurney's point, no RUQ tenderness or Murphy's sign, no left-sided abdominal tenderness, no fluid wave,  or signs of peritonitis   Musculoskeletal: Normal range of motion. She exhibits no edema or tenderness.  Neurological: She is alert and oriented to person, place, and time.  GCS 15 Moves all extremities, speech is clear, movements are goal oriented  Skin: Skin is warm and dry.  1 cm laceration to left posterior scalp, bleeding controlled  Psychiatric: She has a normal mood and affect. Her behavior is normal. Judgment and thought content normal.  Nursing note and vitals reviewed.   ED Course  Procedures (including critical care time) Results for orders placed or performed during the hospital encounter of [id number]  Basic metabolic panel  Result Value Ref Range   Sodium 129 (L) 135 - 145 mmol/L   Potassium 2.7 (LL) 3.5 - 5.1 mmol/L   Chloride 95 (L) 101 - 111 mmol/L   CO2 22 22 - 32 mmol/L   Glucose, Bld 231 (H) 65 - 99 mg/dL   BUN 17 6 - 20 mg/dL   Creatinine, Ser 0.99 0.44 - 1.00 mg/dL   Calcium 7.6 (L) 8.9 - 10.3 mg/dL   GFR calc non Af Amer 50 (L) >60 mL/min   GFR calc Af Amer 58 (L) >60 mL/min   Anion gap 12 5 - 15  CBC  Result Value Ref Range   WBC 10.5 4.0 - 10.5 K/uL   RBC 2.83 (L) 3.87 - 5.11 MIL/uL   Hemoglobin 7.0 (L) 12.0 - 15.0 g/dL   HCT 22.4 (L) 36.0 - 46.0 %   MCV 79.2 78.0 - 100.0 fL   MCH 24.7 (L) 26.0 - 34.0 pg   MCHC 31.3 30.0 - 36.0 g/dL   RDW 14.9 11.5 - 15.5 %   Platelets 416 (H) 150 - 400 K/uL  Urinalysis, Routine w reflex microscopic (not at [facility])  Result Value Ref Range   Color, Urine AMBER (A) YELLOW   APPearance CLOUDY (A) CLEAR   Specific Gravity, Urine 1.018 1.005 - 1.030   pH 6.0 5.0 - 8.0   Glucose, UA NEGATIVE NEGATIVE mg/dL   Hgb urine dipstick SMALL (A) NEGATIVE   Bilirubin Urine SMALL (A) NEGATIVE   Ketones, ur NEGATIVE NEGATIVE mg/dL   Protein, ur 30 (A) NEGATIVE mg/dL   Nitrite NEGATIVE NEGATIVE   Leukocytes, UA MODERATE (A) NEGATIVE  Urine microscopic-add on  Result Value Ref Range   Squamous Epithelial / LPF 0-5 (A) NONE SEEN    WBC, UA 6-30 0 - 5 WBC/hpf   RBC / HPF 0-5 0 - 5 RBC/hpf   Bacteria, UA FEW (A) NONE SEEN   Trichomonas, UA PRESENT   I-Stat CG4 Lactic Acid, ED  Result Value Ref Range   Lactic Acid, Venous 5.37 (HH) 0.5 - 2.0 mmol/L   Comment NOTIFIED PHYSICIAN   I-Stat CG4 Lactic Acid, ED  (not at  [facility])  Result Value Ref Range   Lactic Acid,  Venous 4.73 (HH) 0.5 - 2.0 mmol/L   Comment NOTIFIED PHYSICIAN    Dg Chest 2 View  04/23/2016  CLINICAL DATA:  Status post fall backward, hitting head on concrete. Concern for chest injury. Initial encounter. EXAM: CHEST  2 VIEW COMPARISON:  Chest radiograph performed 12/28/2014 FINDINGS: The lungs are well-aerated. Pulmonary vascularity is at the upper limits of normal. Minimal right midlung atelectasis is noted. There is no evidence of pleural effusion or pneumothorax. The heart is normal in size; the mediastinal contour is within normal limits. No acute osseous abnormalities are seen. IMPRESSION: Minimal right midlung atelectasis noted. Lungs otherwise clear. No displaced rib fractures identified. Electronically Signed   By: Garald Balding M.D.   On: 04/23/2016 18:35   Ct Head Wo Contrast  04/23/2016  CLINICAL DATA:  Syncope.  Fall. EXAM: CT HEAD WITHOUT CONTRAST CT CERVICAL SPINE WITHOUT CONTRAST TECHNIQUE: Multidetector CT imaging of the head and cervical spine was performed following the standard protocol without intravenous contrast. Multiplanar CT image reconstructions of the cervical spine were also generated. COMPARISON:  None. FINDINGS: CT HEAD FINDINGS There is a moderate left parietal scalp contusion with associated subcutaneous emphysema. No evidence of parenchymal hemorrhage or extra-axial fluid collection. No mass lesion, mass effect, or midline shift. No CT evidence of acute infarction. Intracranial atherosclerosis. Nonspecific moderate subcortical and periventricular white matter hypodensity, most in keeping with chronic small vessel ischemic change.  Generalized cerebral volume loss. No ventriculomegaly. The visualized paranasal sinuses are essentially clear. The mastoid air cells are unopacified. No evidence of calvarial fracture. CT CERVICAL SPINE FINDINGS No fracture is detected in the cervical spine. No prevertebral soft tissue swelling. There is straightening of the cervical spine, usually due to positioning and/or muscle spasm. Dens is well positioned between the lateral masses of C1. The lateral masses appear well-aligned. Moderate degenerative disc disease in the lower cervical spine, most prominent at C6-7. Moderate bilateral facet arthropathy. Mild degenerative foraminal stenosis on the right at C5-6 and bilaterally at C6-7. Minimal 2 mm anterolisthesis at C4-5, C5-6 and C7-T1. Visualized mastoid air cells appear clear. No evidence of intra-axial hemorrhage in the visualized brain. No gross cervical canal hematoma. No significant pulmonary nodules at the visualized lung apices. Total thyroidectomy. No cervical adenopathy or other significant neck soft tissue abnormality. IMPRESSION: 1. Moderate left parietal scalp contusion with associated subcutaneous emphysema suggesting associated scalp laceration. 2. No evidence of acute intracranial abnormality. No evidence of calvarial fracture. 3. No cervical spine fracture. 4. Moderate degenerative changes in the cervical spine as described. Minimal multilevel cervical spondylolisthesis, probably degenerative. Electronically Signed   By: Ilona Sorrel M.D.   On: 04/23/2016 19:06   Ct Cervical Spine Wo Contrast  04/23/2016  CLINICAL DATA:  Syncope.  Fall. EXAM: CT HEAD WITHOUT CONTRAST CT CERVICAL SPINE WITHOUT CONTRAST TECHNIQUE: Multidetector CT imaging of the head and cervical spine was performed following the standard protocol without intravenous contrast. Multiplanar CT image reconstructions of the cervical spine were also generated. COMPARISON:  None. FINDINGS: CT HEAD FINDINGS There is a moderate left  parietal scalp contusion with associated subcutaneous emphysema. No evidence of parenchymal hemorrhage or extra-axial fluid collection. No mass lesion, mass effect, or midline shift. No CT evidence of acute infarction. Intracranial atherosclerosis. Nonspecific moderate subcortical and periventricular white matter hypodensity, most in keeping with chronic small vessel ischemic change. Generalized cerebral volume loss. No ventriculomegaly. The visualized paranasal sinuses are essentially clear. The mastoid air cells are unopacified. No evidence of calvarial fracture. CT CERVICAL SPINE  FINDINGS No fracture is detected in the cervical spine. No prevertebral soft tissue swelling. There is straightening of the cervical spine, usually due to positioning and/or muscle spasm. Dens is well positioned between the lateral masses of C1. The lateral masses appear well-aligned. Moderate degenerative disc disease in the lower cervical spine, most prominent at C6-7. Moderate bilateral facet arthropathy. Mild degenerative foraminal stenosis on the right at C5-6 and bilaterally at C6-7. Minimal 2 mm anterolisthesis at C4-5, C5-6 and C7-T1. Visualized mastoid air cells appear clear. No evidence of intra-axial hemorrhage in the visualized brain. No gross cervical canal hematoma. No significant pulmonary nodules at the visualized lung apices. Total thyroidectomy. No cervical adenopathy or other significant neck soft tissue abnormality. IMPRESSION: 1. Moderate left parietal scalp contusion with associated subcutaneous emphysema suggesting associated scalp laceration. 2. No evidence of acute intracranial abnormality. No evidence of calvarial fracture. 3. No cervical spine fracture. 4. Moderate degenerative changes in the cervical spine as described. Minimal multilevel cervical spondylolisthesis, probably degenerative. Electronically Signed   By: Ilona Sorrel M.D.   On: 04/23/2016 19:06   US Abdomen Complete  04/06/2016  CLINICAL DATA:   Cholecystectomy. Appendectomy. Elevated alkaline phosphatase. EXAM: ABDOMEN ULTRASOUND COMPLETE COMPARISON:  None. FINDINGS: Gallbladder: Absent Common bile duct: Diameter: 6 mm Liver: No focal lesion identified. Within normal limits in parenchymal echogenicity. IVC: No abnormality visualized. Pancreas: Visualized portion unremarkable. Spleen: Size and appearance within normal limits. Right Kidney: Length: 11.0 cm. Echogenicity within normal limits. No mass or hydronephrosis visualized. Left Kidney: Length: 9.9 cm. No hydronephrosis. Normal cortical echogenicity. Benign-appearing cyst in the upper pole measures 1.2 cm. Abdominal aorta: No aneurysm visualized. Other findings: None. IMPRESSION: Postcholecystectomy.  No acute intra-abdominal pathology. Electronically Signed   By: Marybelle Killings M.D.   On: 04/06/2016 09:50    I have personally reviewed and evaluated these images and lab results as part of my medical decision-making.   EKG Interpretation   Date/Time:  Saturday Apr 23 2016 16:36:59 EDT Ventricular Rate:  129 PR Interval:  122 QRS Duration: 77 QT Interval:  241 QTC Calculation: 353 R Axis:   50 Text Interpretation:  Sinus tachycardia Atrial premature complex  Borderline repolarization abnormality No significant change since last  tracing Confirmed by LITTLE MD, RACHEL [id number]) on 04/23/2016 5:08:33 PM      LACERATION REPAIR Performed by: Montine Circle Authorized by: Montine Circle Consent: Verbal consent obtained. Risks and benefits: risks, benefits and alternatives were discussed Consent given by: patient Patient identity confirmed: provided demographic data Prepped and Draped in normal sterile fashion Wound explored  Laceration Location: posterior scalp  Laceration Length: 1 cm  No Foreign Bodies seen or palpated  Anesthesia: none  Local anesthetic: none  Anesthetic total: none  Irrigation method: syringe Amount of cleaning: standard  Skin closure:  staple  Number of sutures: 1 staple  Technique: staple Patient tolerance: Patient tolerated the procedure well with no immediate complications.  MDM   Final diagnoses:  Sepsis, due to unspecified organism [facility])  UTI (lower urinary tract infection)  Fall, initial encounter  Scalp laceration, initial encounter  Trichomonas infection  Hypokalemia    Patient with reportedly mechanical fall at home. She fell backwards and hit her head on the concrete.  Vital signs noted to be remarkable for low-grade temperature of 99.8, and tachycardia to 123. Will check lactic acid. We'll also check EKG to ensure that there are no arrhythmias which could cause patient to syncopized. Patient seen by and discussed with Dr. Rex Kras.  Lactate  is 5.3. Patient meets sepsis criteria.  Will start weight based fluid resuscitation. Will give empiric antibiotics.  Patient has received 2 L of fluid. I have instructed the nursing team to stop the fluid bolus at this point as 30 cc/kg have been reached.  7:57 PM Temperature 99.3, heart rate is decreased 111, respiration rate 21, mildly hypertensive at 152/90. Patient is alert and oriented, and feeling improved.  CT scan of head and cervical spine are negative. Laceration repaired with one staple in the ED.  Chest x-ray shows mild atelectasis, but no focal pneumonia.  Urinalysis is concerning for UTI. Urine also has evidence of trichomonas. I discussed this with the patient. She denies being sexually active. I suspect this is the patient's source for her sepsis. Patient will need to be admitted for urosepsis.  CRITICAL CARE Performed by: Montine Circle   Total critical care time: 45 minutes  Critical care time was exclusive of separately billable procedures and treating other patients.  Critical care was necessary to treat or prevent imminent or life-threatening deterioration.  Critical care was time spent personally by me on the following activities:  development of treatment plan with patient and/or surrogate as well as nursing, discussions with consultants, evaluation of patient's response to treatment, examination of patient, obtaining history from patient or surrogate, ordering and performing treatments and interventions, ordering and review of laboratory studies, ordering and review of radiographic studies, pulse oximetry and re-evaluation of patient's condition.   Montine Circle, PA-C 04/23/16 2059  Sharlett Iles, MD 04/24/16 [phone]

## 2016-04-23 NOTE — ED Notes (Signed)
Pt transported to xray 

## 2016-04-23 NOTE — Progress Notes (Signed)
Pharmacy Antibiotic Note  [name] is a 82 y.o. female admitted on 04/23/2016 with sepsis.  Pharmacy has been consulted for vancomycin and zosyn dosing.  Plan: Vancomycin 1 g x 1 then 750 mg IV every 12 hours.  Goal trough 15-20 mcg/mL. Zosyn 3.375g IV q8h (4 hour infusion).  Height: 4\' 11"  (149.9 cm) Weight: 125 lb 2 oz (56.756 kg) IBW/kg (Calculated) : 43.2  Temp (24hrs), Avg:99.8 F (37.7 C), Min:99.8 F (37.7 C), Max:99.8 F (37.7 C)   Recent Labs Lab 04/23/16 1649 04/23/16 1659  WBC 10.5  --   CREATININE 0.99  --   LATICACIDVEN  --  5.37*    Estimated Creatinine Clearance: 31.3 mL/min (by C-G formula based on Cr of 0.99).    No Known Allergies  Levester Fresh, PharmD, BCPS, [facility] Clinical Pharmacist Pager [phone] 04/23/2016 6:08 PM

## 2016-04-24 DIAGNOSIS — E876 Hypokalemia: Secondary | ICD-10-CM

## 2016-04-24 DIAGNOSIS — A599 Trichomoniasis, unspecified: Secondary | ICD-10-CM

## 2016-04-24 DIAGNOSIS — D509 Iron deficiency anemia, unspecified: Secondary | ICD-10-CM

## 2016-04-24 DIAGNOSIS — E871 Hypo-osmolality and hyponatremia: Secondary | ICD-10-CM | POA: Diagnosis not present

## 2016-04-24 DIAGNOSIS — A419 Sepsis, unspecified organism: Secondary | ICD-10-CM

## 2016-04-24 DIAGNOSIS — N390 Urinary tract infection, site not specified: Secondary | ICD-10-CM

## 2016-04-24 LAB — RETICULOCYTES
RBC.: 2.38 MIL/uL — ABNORMAL LOW (ref 3.87–5.11)
RBC.: 3.06 MIL/uL — ABNORMAL LOW (ref 3.87–5.11)
RETIC COUNT ABSOLUTE: 91.8 10*3/uL (ref 19.0–186.0)
RETIC CT PCT: 3.7 % — AB (ref 0.4–3.1)
Retic Count, Absolute: 88.1 10*3/uL (ref 19.0–186.0)
Retic Ct Pct: 3 % (ref 0.4–3.1)

## 2016-04-24 LAB — COMPREHENSIVE METABOLIC PANEL
ALBUMIN: 1.5 g/dL — AB (ref 3.5–5.0)
ALK PHOS: 563 U/L — AB (ref 38–126)
ALT: 19 U/L (ref 14–54)
ANION GAP: 8 (ref 5–15)
AST: 60 U/L — ABNORMAL HIGH (ref 15–41)
BILIRUBIN TOTAL: 1.5 mg/dL — AB (ref 0.3–1.2)
BUN: 11 mg/dL (ref 6–20)
CALCIUM: 7.5 mg/dL — AB (ref 8.9–10.3)
CO2: 23 mmol/L (ref 22–32)
Chloride: 102 mmol/L (ref 101–111)
Creatinine, Ser: 0.83 mg/dL (ref 0.44–1.00)
GLUCOSE: 105 mg/dL — AB (ref 65–99)
POTASSIUM: 3.4 mmol/L — AB (ref 3.5–5.1)
Sodium: 133 mmol/L — ABNORMAL LOW (ref 135–145)
TOTAL PROTEIN: 8.6 g/dL — AB (ref 6.5–8.1)

## 2016-04-24 LAB — HEMOGLOBIN AND HEMATOCRIT, BLOOD
HCT: 28.2 % — ABNORMAL LOW (ref 36.0–46.0)
Hemoglobin: 9.2 g/dL — ABNORMAL LOW (ref 12.0–15.0)

## 2016-04-24 LAB — BASIC METABOLIC PANEL
Anion gap: 10 (ref 5–15)
BUN: 14 mg/dL (ref 6–20)
CHLORIDE: 99 mmol/L — AB (ref 101–111)
CO2: 21 mmol/L — AB (ref 22–32)
CREATININE: 0.93 mg/dL (ref 0.44–1.00)
Calcium: 7.2 mg/dL — ABNORMAL LOW (ref 8.9–10.3)
GFR calc Af Amer: >60 mL/min (ref >60)
GFR calc non Af Amer: 54 mL/min — ABNORMAL LOW (ref >60)
Glucose, Bld: 172 mg/dL — ABNORMAL HIGH (ref 65–99)
Potassium: 3.3 mmol/L — ABNORMAL LOW (ref 3.5–5.1)
SODIUM: 130 mmol/L — AB (ref 135–145)

## 2016-04-24 LAB — OCCULT BLOOD X 1 CARD TO LAB, STOOL: FECAL OCCULT BLD: NEGATIVE

## 2016-04-24 LAB — CBC
HEMATOCRIT: 18.9 % — AB (ref 36.0–46.0)
HEMATOCRIT: 24 % — AB (ref 36.0–46.0)
HEMOGLOBIN: 6 g/dL — AB (ref 12.0–15.0)
HEMOGLOBIN: 7.6 g/dL — AB (ref 12.0–15.0)
MCH: 24.8 pg — AB (ref 26.0–34.0)
MCH: 24.8 pg — AB (ref 26.0–34.0)
MCHC: 31.7 g/dL (ref 30.0–36.0)
MCHC: 31.7 g/dL (ref 30.0–36.0)
MCV: 78.1 fL (ref 78.0–100.0)
MCV: 78.4 fL (ref 78.0–100.0)
Platelets: 368 10*3/uL (ref 150–400)
Platelets: 389 10*3/uL (ref 150–400)
RBC: 2.42 MIL/uL — ABNORMAL LOW (ref 3.87–5.11)
RBC: 3.06 MIL/uL — ABNORMAL LOW (ref 3.87–5.11)
RDW: 14.7 % (ref 11.5–15.5)
RDW: 14.8 % (ref 11.5–15.5)
WBC: 12.4 10*3/uL — ABNORMAL HIGH (ref 4.0–10.5)
WBC: 11.3 10*3/uL — ABNORMAL HIGH (ref 4.0–10.5)

## 2016-04-24 LAB — ABO/RH: ABO/RH(D): A POS

## 2016-04-24 LAB — GLUCOSE, CAPILLARY
GLUCOSE-CAPILLARY: 106 mg/dL — AB (ref 65–99)
GLUCOSE-CAPILLARY: 136 mg/dL — AB (ref 65–99)
Glucose-Capillary: 116 mg/dL — ABNORMAL HIGH (ref 65–99)
Glucose-Capillary: 122 mg/dL — ABNORMAL HIGH (ref 65–99)

## 2016-04-24 LAB — URINE CULTURE

## 2016-04-24 LAB — PREPARE RBC (CROSSMATCH)

## 2016-04-24 LAB — CALCIUM, IONIZED: Calcium, Ionized, Serum: 4.2 mg/dL — ABNORMAL LOW (ref 4.5–5.6)

## 2016-04-24 LAB — LACTIC ACID, PLASMA
LACTIC ACID, VENOUS: 2.2 mmol/L — AB (ref 0.5–2.0)
Lactic Acid, Venous: 3.9 mmol/L (ref 0.5–2.0)

## 2016-04-24 LAB — IRON AND TIBC
IRON: 59 ug/dL (ref 28–170)
Saturation Ratios: 30 % (ref 10.4–31.8)
TIBC: 196 ug/dL — AB (ref 250–450)
UIBC: 137 ug/dL

## 2016-04-24 LAB — GAMMA GT: GGT: 348 U/L — AB (ref 7–50)

## 2016-04-24 LAB — FOLATE: FOLATE: 13.2 ng/mL (ref >5.9)

## 2016-04-24 LAB — FERRITIN: Ferritin: 133 ng/mL (ref 11–307)

## 2016-04-24 LAB — MRSA PCR SCREENING: MRSA BY PCR: NEGATIVE

## 2016-04-24 LAB — LACTATE DEHYDROGENASE: LDH: 142 U/L (ref 98–192)

## 2016-04-24 LAB — VITAMIN B12: Vitamin B-12: >7500 pg/mL — ABNORMAL HIGH (ref 180–914)

## 2016-04-24 LAB — MAGNESIUM: MAGNESIUM: 1.3 mg/dL — AB (ref 1.7–2.4)

## 2016-04-24 MED ORDER — MAGNESIUM SULFATE 50 % IJ SOLN
3.0000 g | Freq: Two times a day (BID) | INTRAVENOUS | Status: AC
  Administered 2016-04-24: 3 g via INTRAVENOUS
  Filled 2016-04-24 (×2): qty 6

## 2016-04-24 MED ORDER — SODIUM CHLORIDE 0.9 % IV SOLN
Freq: Once | INTRAVENOUS | Status: AC
  Administered 2016-04-24: 03:00:00 via INTRAVENOUS

## 2016-04-24 MED ORDER — SODIUM CHLORIDE 0.9 % IV BOLUS (SEPSIS)
500.0000 mL | Freq: Once | INTRAVENOUS | Status: AC
  Administered 2016-04-24: 500 mL via INTRAVENOUS

## 2016-04-24 MED ORDER — SODIUM CHLORIDE 0.9 % IV SOLN: Freq: Once | INTRAVENOUS | Status: DC

## 2016-04-24 NOTE — Progress Notes (Signed)
CRITICAL VALUE ALERT  Critical value received:  hgb 6  Date of notification:  04/24/16  Time of notification:  0207  Critical value read back: Yes  Nurse who received alert:  Reatha Armour, RN  MD notified (1st page):  Cana  Time of first page:  0210

## 2016-04-24 NOTE — Progress Notes (Signed)
CRITICAL VALUE ALERT  Critical value received:  Lactic acid 3.9  Date of notification:  04/24/16  Time of notification:  0126  Critical value read back: Yes  Nurse who received alert:  Remo Lipps, RN  MD notified (1st page):  Rogue Bussing  Time of first page:  0127

## 2016-04-24 NOTE — Progress Notes (Signed)
[facility]                                                                              Patient Demographics  [name], is a 82 y.o. female, DOB - [birth year], CNO:[id number]  Admit date - 04/23/2016   Admitting Physician Edwin Dada, MD  Outpatient Primary MD for the patient is Charolette Forward, MD  Outpatient specialists:   LOS - 1  days    Chief Complaint  Patient presents with  . Fall  . Head Laceration       Brief summary   Patient is a 82 year old female with NIDDM, HTN, hypothyroidism who presents with weakness for 1 week and dysuria. Patient presented with increasing weakness, fatigue in the last week. On the day of admission had dysuria similar to one month ago when she had a UTI which was treated. In the known she was walking on the grass, tripped and struck the back of her head on the cement which bled hence came to the ED. Otherwise no fevers, chills or any flank pain. In ED she had low-grade temperature, tachycardia to 120, to keep neck. CT head was negative. Received one staple to the head. Lactic acid 5.37, UA positive for UTI. Hemoglobin 7.0. Patient was started on IV antibiotics and transfusion and admitted for further workup.   Assessment & Plan    Principal Problem: Sepsis from UTI:  - Patient met Sirs criteria with tachycardia, tachypnea, UTI, lactic acidosis, leukocytosis. - Continue IV Rocephin until urine cultures blood cultures available - Overall improving, lactic acid down, afebrile, hemodynamically stable   Hyponatremia: Likely due to #1, dehydration - Continue gentle hydration improving to 133 today from 129 at the time of admission  Hypokalemia with hypomagnesemia:  - Potassium improving, continue normal saline with potassium replacement - Magnesium 1.3, placed on IV magnesium replacement   Anemia, normocytic - Obtain anemia panel, FOBT, SPEP, UPEP/immunofixation - Elevated total protein with anemia, alkaline  phosphatase, rule out multiple myeloma - Previous colonoscopy at the age of 25, patient was told that she will not need any further endoscopies - Hemoglobin still 7.6, transfuse 1 more unit - LDH within normal limits, haptoglobin pending  Isolated elevated alkaline phosphatase - Rule out multiple myeloma, with anemia and elevated protein, hypoactive anemia - Outpatient workup has been done as well, patient had abdominal ultrasound done on 5/10 which had shown postcholecystectomy otherwise no acute intra-abdominal pathology/biliary dilatation or obstruction - Obtain GGT, fractionated alkaline phosphatase  pseudoHypocalcemia:  - Corrected calcium with albumin 9.5  Trichomonas:  Seems implausible that pt has STD, but can't rule out true positive -Metronidazole oral 2 g once -Zofran PRN  HTN:  -Hold Exforge and metoprolol while BP soft   Diabetes, NIDDM: -Hold metformin given lactic acidosis - Lactic acid improving, continue sliding scale insulin   Hypothyroidism, post-surgical: -Continue levothyroxine  Fall - Mechanical fall, CT head showed moderate left parietal scalp contusion with associated subcutaneous emphysema suggesting scalp laceration no fractures.  Code Status: DO NOT RESUSCITATE   DVT Prophylaxis:   SCD's   Family Communication: Discussed in detail with the patient, all imaging results, lab  results explained to the patient  and patient's grandson at the bedside  Disposition Plan: Transfer to telemetry  Time Spent in minutes  25 minutes  Procedures:  CT head  Consultants:   None   Antimicrobials:   IV vanc  1 dose   IV zosyn1 dose  IV Rocephin 5/28    Medications  Scheduled Meds: . sodium chloride   Intravenous Once  . aspirin EC  81 mg Oral QPM  . cefTRIAXone (ROCEPHIN)  IV  1 g Intravenous Q24H  . ferrous sulfate  325 mg Oral Daily  . insulin aspart  0-5 Units Subcutaneous QHS  . insulin aspart  0-9 Units Subcutaneous TID WC  .  levothyroxine  112 mcg Oral QAC breakfast  . sodium chloride flush  3 mL Intravenous Q12H   Continuous Infusions: . 0.9 % NaCl with KCl 40 mEq / L 75 mL/hr (04/24/16 0212)   PRN Meds:.acetaminophen **OR** acetaminophen, ondansetron **OR** ondansetron (ZOFRAN) IV   Antibiotics   Anti-infectives    Start     Dose/Rate Route Frequency Ordered Stop   04/24/16 1800  vancomycin (VANCOCIN) IVPB 750 mg/150 ml premix  Status:  Discontinued     750 mg 150 mL/hr over 60 Minutes Intravenous Every 24 hours 04/23/16 1809 04/23/16 2120   04/24/16 0315  cefTRIAXone (ROCEPHIN) 2 g in dextrose 5 % 50 mL IVPB  Status:  Discontinued     2 g 100 mL/hr over 30 Minutes Intravenous  Once 04/23/16 2120 04/23/16 2137   04/24/16 0200  piperacillin-tazobactam (ZOSYN) IVPB 3.375 g  Status:  Discontinued     3.375 g 12.5 mL/hr over 240 Minutes Intravenous Every 8 hours 04/23/16 1809 04/23/16 2120   04/24/16 0000  cefTRIAXone (ROCEPHIN) 1 g in dextrose 5 % 50 mL IVPB     1 g 100 mL/hr over 30 Minutes Intravenous Every 24 hours 04/23/16 2137     04/23/16 2200  metroNIDAZOLE (FLAGYL) tablet 2,000 mg     2,000 mg Oral  Once 04/23/16 2120 04/23/16 2213   04/23/16 1715  piperacillin-tazobactam (ZOSYN) IVPB 3.375 g     3.375 g 100 mL/hr over 30 Minutes Intravenous  Once 04/23/16 1708 04/23/16 1904   04/23/16 1715  vancomycin (VANCOCIN) IVPB 1000 mg/200 mL premix     1,000 mg 200 mL/hr over 60 Minutes Intravenous  Once 04/23/16 1708 04/23/16 1904        Subjective:   [name] was seen and examined today.  Patient denies dizziness, chest pain, shortness of breath, abdominal pain, N/V/D/C, new weakness, numbess, tingling. No acute events overnight.    Objective:   Filed Vitals:   04/24/16 0400 04/24/16 0524 04/24/16 0600 04/24/16 0815  BP: 128/69 138/79 133/80 138/74  Pulse: 86 85 85 79  Temp: 98.1 F (36.7 C) 98.6 F (37 C)  97.7 F (36.5 C)  TempSrc: Oral Oral  Oral  Resp: '19 17 15 19  '$ Height:       Weight:      SpO2: 97% 99% 100% 100%    Intake/Output Summary (Last 24 hours) at 04/24/16 0919 Last data filed at 04/24/16 0750  Gross per 24 hour  Intake   5485 ml  Output   1200 ml  Net   4285 ml     Wt Readings from Last 3 Encounters:  04/23/16 59.8 kg (131 lb 13.4 oz)     Exam  General: Alert and oriented x 3, NAD  HEENT:  PERRLA, EOMI, Anicteric Sclera, mucous membranes  moist.   Neck: Supple, no JVD, no masses  Cardiovascular: S1 S2 auscultated, no rubs, murmurs or gallops. Regular rate and rhythm.  Respiratory: Clear to auscultation bilaterally, no wheezing, rales or rhonchi  Gastrointestinal: Soft, nontender, nondistended, + bowel sounds  Ext: no cyanosis clubbing or edema  Neuro: AAOx3, Cr N's II- XII. Strength 5/5 upper and lower extremities bilaterally  Skin: No rashes  Psych: Normal affect and demeanor, alert and oriented x3    Data Reviewed:  I have personally reviewed following labs and imaging studies  Micro Results Recent Results (from the past 240 hour(s))  Blood Culture (routine x 2)     Status: None (Preliminary result)   Collection Time: 04/23/16  5:30 PM  Result Value Ref Range Status   Specimen Description BLOOD RIGHT ANTECUBITAL  Final   Special Requests BOTTLES DRAWN AEROBIC AND ANAEROBIC 5CC  Final   Culture PENDING  Incomplete   Report Status PENDING  Incomplete  MRSA PCR Screening     Status: None   Collection Time: 04/23/16  9:58 PM  Result Value Ref Range Status   MRSA by PCR NEGATIVE NEGATIVE Final    Comment:        The GeneXpert MRSA Assay (FDA approved for NASAL specimens only), is one component of a comprehensive MRSA colonization surveillance program. It is not intended to diagnose MRSA infection nor to guide or monitor treatment for MRSA infections.     Radiology Reports Dg Chest 2 View  04/23/2016  CLINICAL DATA:  Status post fall backward, hitting head on concrete. Concern for chest injury. Initial  encounter. EXAM: CHEST  2 VIEW COMPARISON:  Chest radiograph performed 12/28/2014 FINDINGS: The lungs are well-aerated. Pulmonary vascularity is at the upper limits of normal. Minimal right midlung atelectasis is noted. There is no evidence of pleural effusion or pneumothorax. The heart is normal in size; the mediastinal contour is within normal limits. No acute osseous abnormalities are seen. IMPRESSION: Minimal right midlung atelectasis noted. Lungs otherwise clear. No displaced rib fractures identified. Electronically Signed   By: Garald Balding M.D.   On: 04/23/2016 18:35   Ct Head Wo Contrast  04/23/2016  CLINICAL DATA:  Syncope.  Fall. EXAM: CT HEAD WITHOUT CONTRAST CT CERVICAL SPINE WITHOUT CONTRAST TECHNIQUE: Multidetector CT imaging of the head and cervical spine was performed following the standard protocol without intravenous contrast. Multiplanar CT image reconstructions of the cervical spine were also generated. COMPARISON:  None. FINDINGS: CT HEAD FINDINGS There is a moderate left parietal scalp contusion with associated subcutaneous emphysema. No evidence of parenchymal hemorrhage or extra-axial fluid collection. No mass lesion, mass effect, or midline shift. No CT evidence of acute infarction. Intracranial atherosclerosis. Nonspecific moderate subcortical and periventricular white matter hypodensity, most in keeping with chronic small vessel ischemic change. Generalized cerebral volume loss. No ventriculomegaly. The visualized paranasal sinuses are essentially clear. The mastoid air cells are unopacified. No evidence of calvarial fracture. CT CERVICAL SPINE FINDINGS No fracture is detected in the cervical spine. No prevertebral soft tissue swelling. There is straightening of the cervical spine, usually due to positioning and/or muscle spasm. Dens is well positioned between the lateral masses of C1. The lateral masses appear well-aligned. Moderate degenerative disc disease in the lower cervical  spine, most prominent at C6-7. Moderate bilateral facet arthropathy. Mild degenerative foraminal stenosis on the right at C5-6 and bilaterally at C6-7. Minimal 2 mm anterolisthesis at C4-5, C5-6 and C7-T1. Visualized mastoid air cells appear clear. No evidence of intra-axial hemorrhage in  the visualized brain. No gross cervical canal hematoma. No significant pulmonary nodules at the visualized lung apices. Total thyroidectomy. No cervical adenopathy or other significant neck soft tissue abnormality. IMPRESSION: 1. Moderate left parietal scalp contusion with associated subcutaneous emphysema suggesting associated scalp laceration. 2. No evidence of acute intracranial abnormality. No evidence of calvarial fracture. 3. No cervical spine fracture. 4. Moderate degenerative changes in the cervical spine as described. Minimal multilevel cervical spondylolisthesis, probably degenerative. Electronically Signed   By: Ilona Sorrel M.D.   On: 04/23/2016 19:06   Ct Cervical Spine Wo Contrast  04/23/2016  CLINICAL DATA:  Syncope.  Fall. EXAM: CT HEAD WITHOUT CONTRAST CT CERVICAL SPINE WITHOUT CONTRAST TECHNIQUE: Multidetector CT imaging of the head and cervical spine was performed following the standard protocol without intravenous contrast. Multiplanar CT image reconstructions of the cervical spine were also generated. COMPARISON:  None. FINDINGS: CT HEAD FINDINGS There is a moderate left parietal scalp contusion with associated subcutaneous emphysema. No evidence of parenchymal hemorrhage or extra-axial fluid collection. No mass lesion, mass effect, or midline shift. No CT evidence of acute infarction. Intracranial atherosclerosis. Nonspecific moderate subcortical and periventricular white matter hypodensity, most in keeping with chronic small vessel ischemic change. Generalized cerebral volume loss. No ventriculomegaly. The visualized paranasal sinuses are essentially clear. The mastoid air cells are unopacified. No  evidence of calvarial fracture. CT CERVICAL SPINE FINDINGS No fracture is detected in the cervical spine. No prevertebral soft tissue swelling. There is straightening of the cervical spine, usually due to positioning and/or muscle spasm. Dens is well positioned between the lateral masses of C1. The lateral masses appear well-aligned. Moderate degenerative disc disease in the lower cervical spine, most prominent at C6-7. Moderate bilateral facet arthropathy. Mild degenerative foraminal stenosis on the right at C5-6 and bilaterally at C6-7. Minimal 2 mm anterolisthesis at C4-5, C5-6 and C7-T1. Visualized mastoid air cells appear clear. No evidence of intra-axial hemorrhage in the visualized brain. No gross cervical canal hematoma. No significant pulmonary nodules at the visualized lung apices. Total thyroidectomy. No cervical adenopathy or other significant neck soft tissue abnormality. IMPRESSION: 1. Moderate left parietal scalp contusion with associated subcutaneous emphysema suggesting associated scalp laceration. 2. No evidence of acute intracranial abnormality. No evidence of calvarial fracture. 3. No cervical spine fracture. 4. Moderate degenerative changes in the cervical spine as described. Minimal multilevel cervical spondylolisthesis, probably degenerative. Electronically Signed   By: Ilona Sorrel M.D.   On: 04/23/2016 19:06   US Abdomen Complete  04/06/2016  CLINICAL DATA:  Cholecystectomy. Appendectomy. Elevated alkaline phosphatase. EXAM: ABDOMEN ULTRASOUND COMPLETE COMPARISON:  None. FINDINGS: Gallbladder: Absent Common bile duct: Diameter: 6 mm Liver: No focal lesion identified. Within normal limits in parenchymal echogenicity. IVC: No abnormality visualized. Pancreas: Visualized portion unremarkable. Spleen: Size and appearance within normal limits. Right Kidney: Length: 11.0 cm. Echogenicity within normal limits. No mass or hydronephrosis visualized. Left Kidney: Length: 9.9 cm. No hydronephrosis.  Normal cortical echogenicity. Benign-appearing cyst in the upper pole measures 1.2 cm. Abdominal aorta: No aneurysm visualized. Other findings: None. IMPRESSION: Postcholecystectomy.  No acute intra-abdominal pathology. Electronically Signed   By: Marybelle Killings M.D.   On: 04/06/2016 09:50    Lab Data:  CBC:  Recent Labs Lab 04/23/16 1649 04/24/16 0136 04/24/16 0727  WBC 10.5 12.4* 11.3*  HGB 7.0* 6.0* 7.6*  HCT 22.4* 18.9* 24.0*  MCV 79.2 78.1 78.4  PLT 416* 368 017   Basic Metabolic Panel:  Recent Labs Lab 04/23/16 1649 04/24/16 0026 04/24/16 4944  NA 129* 130* 133*  K 2.7* 3.3* 3.4*  CL 95* 99* 102  CO2 22 21* 23  GLUCOSE 231* 172* 105*  BUN '17 14 11  '$ CREATININE 0.99 0.93 0.83  CALCIUM 7.6* 7.2* 7.5*  MG  --   --  1.3*   GFR: Estimated Creatinine Clearance: 36.2 mL/min (by C-G formula based on Cr of 0.83). Liver Function Tests:  Recent Labs Lab 04/24/16 0727  AST 60*  ALT 19  ALKPHOS 563*  BILITOT 1.5*  PROT 8.6*  ALBUMIN 1.5*   No results for input(s): LIPASE, AMYLASE in the last 168 hours. No results for input(s): AMMONIA in the last 168 hours. Coagulation Profile: No results for input(s): INR, PROTIME in the last 168 hours. Cardiac Enzymes: No results for input(s): CKTOTAL, CKMB, CKMBINDEX, TROPONINI in the last 168 hours. BNP (last 3 results) No results for input(s): PROBNP in the last 8760 hours. HbA1C: No results for input(s): HGBA1C in the last 72 hours. CBG:  Recent Labs Lab 04/23/16 2155 04/24/16 0814  GLUCAP 178* 106*   Lipid Profile: No results for input(s): CHOL, HDL, LDLCALC, TRIG, CHOLHDL, LDLDIRECT in the last 72 hours. Thyroid Function Tests: No results for input(s): TSH, T4TOTAL, FREET4, T3FREE, THYROIDAB in the last 72 hours. Anemia Panel:  Recent Labs  04/24/16 0026 04/24/16 0727  FOLATE  --  13.2  RETICCTPCT 3.7* 3.0   Urine analysis:    Component Value Date/Time   COLORURINE AMBER* 04/23/2016 1747   APPEARANCEUR  CLOUDY* 04/23/2016 1747   LABSPEC 1.018 04/23/2016 1747   PHURINE 6.0 04/23/2016 1747   GLUCOSEU NEGATIVE 04/23/2016 1747   HGBUR SMALL* 04/23/2016 1747   BILIRUBINUR SMALL* 04/23/2016 1747   KETONESUR NEGATIVE 04/23/2016 1747   PROTEINUR 30* 04/23/2016 1747   UROBILINOGEN 0.2 05/19/2008 0910   NITRITE NEGATIVE 04/23/2016 1747   LEUKOCYTESUR MODERATE* 04/23/2016 1747     RAI,RIPUDEEP M.D. [facility] 04/24/2016, 9:19 AM  Pager: [phone] Between 7am to 7pm - call Pager - [phone]  After 7pm go to [email or web address] - password [email or web address]  Call night coverage person covering after 7pm

## 2016-04-25 DIAGNOSIS — E876 Hypokalemia: Secondary | ICD-10-CM | POA: Diagnosis not present

## 2016-04-25 DIAGNOSIS — A419 Sepsis, unspecified organism: Secondary | ICD-10-CM | POA: Diagnosis not present

## 2016-04-25 DIAGNOSIS — E871 Hypo-osmolality and hyponatremia: Secondary | ICD-10-CM | POA: Diagnosis not present

## 2016-04-25 LAB — TYPE AND SCREEN
ABO/RH(D): A POS
Antibody Screen: NEGATIVE
UNIT DIVISION: 0
Unit division: 0

## 2016-04-25 LAB — URINE MICROSCOPIC-ADD ON

## 2016-04-25 LAB — URINALYSIS, ROUTINE W REFLEX MICROSCOPIC
Glucose, UA: NEGATIVE mg/dL
HGB URINE DIPSTICK: NEGATIVE
Ketones, ur: NEGATIVE mg/dL
NITRITE: NEGATIVE
PROTEIN: NEGATIVE mg/dL
Specific Gravity, Urine: 1.016 (ref 1.005–1.030)
pH: 6.5 (ref 5.0–8.0)

## 2016-04-25 LAB — CBC
HEMATOCRIT: 28.8 % — AB (ref 36.0–46.0)
HEMOGLOBIN: 9.4 g/dL — AB (ref 12.0–15.0)
MCH: 26.1 pg (ref 26.0–34.0)
MCHC: 32.6 g/dL (ref 30.0–36.0)
MCV: 80 fL (ref 78.0–100.0)
Platelets: 403 10*3/uL — ABNORMAL HIGH (ref 150–400)
RBC: 3.6 MIL/uL — ABNORMAL LOW (ref 3.87–5.11)
RDW: 15.9 % — ABNORMAL HIGH (ref 11.5–15.5)
WBC: 8.2 10*3/uL (ref 4.0–10.5)

## 2016-04-25 LAB — BASIC METABOLIC PANEL
Anion gap: 5 (ref 5–15)
BUN: 11 mg/dL (ref 6–20)
CHLORIDE: 105 mmol/L (ref 101–111)
CO2: 22 mmol/L (ref 22–32)
CREATININE: 0.8 mg/dL (ref 0.44–1.00)
Calcium: 7.5 mg/dL — ABNORMAL LOW (ref 8.9–10.3)
GFR calc Af Amer: >60 mL/min (ref >60)
GFR calc non Af Amer: >60 mL/min (ref >60)
GLUCOSE: 109 mg/dL — AB (ref 65–99)
Potassium: 3.9 mmol/L (ref 3.5–5.1)
SODIUM: 132 mmol/L — AB (ref 135–145)

## 2016-04-25 LAB — HEPATIC FUNCTION PANEL
ALT: 16 U/L (ref 14–54)
AST: 58 U/L — ABNORMAL HIGH (ref 15–41)
Albumin: 1.4 g/dL — ABNORMAL LOW (ref 3.5–5.0)
Alkaline Phosphatase: 532 U/L — ABNORMAL HIGH (ref 38–126)
BILIRUBIN INDIRECT: 0.6 mg/dL (ref 0.3–0.9)
Bilirubin, Direct: 0.8 mg/dL — ABNORMAL HIGH (ref 0.1–0.5)
TOTAL PROTEIN: 8.5 g/dL — AB (ref 6.5–8.1)
Total Bilirubin: 1.4 mg/dL — ABNORMAL HIGH (ref 0.3–1.2)

## 2016-04-25 LAB — GLUCOSE, CAPILLARY
GLUCOSE-CAPILLARY: 100 mg/dL — AB (ref 65–99)
GLUCOSE-CAPILLARY: 133 mg/dL — AB (ref 65–99)
Glucose-Capillary: 130 mg/dL — ABNORMAL HIGH (ref 65–99)
Glucose-Capillary: 150 mg/dL — ABNORMAL HIGH (ref 65–99)

## 2016-04-25 MED ORDER — METOPROLOL SUCCINATE ER 50 MG PO TB24
50.0000 mg | ORAL_TABLET | Freq: Every day | ORAL | Status: DC
  Administered 2016-04-25 – 2016-04-27 (×3): 50 mg via ORAL
  Filled 2016-04-25 (×3): qty 1

## 2016-04-25 NOTE — Evaluation (Addendum)
Physical Therapy Evaluation Patient Details Name: [name] MRN: [id number] DOB: 12-17-1927 Today's Date: 04/25/2016   History of Present Illness  Patient is a 82 y/o female with hx of HTN, asthma and DM presents with weakness for 1 week and dysuria. In the ED, pt found to have sepsis with UTI. Hemoglobin 7.0. S/p fall hitting head , received 1 staple.  Clinical Impression  Patient presents with generalized weakness, dyspnea on exertion, impaired balance and impaired safety awareness impacting mobility. Tolerated gait training with RW for safety however pt refusing to use RW at home. Reports her grandson will be getting her cane. Pt is high fall risk and is home alone during the day. Will follow acutely to maximize independence and mobility prior to return home.    Follow Up Recommendations Home health PT;Supervision for mobility/OOB    Equipment Recommendations  Rolling walker with 5" wheels;Other (comment) (but pt refusing to use RW; shower chair)    Recommendations for Other Services OT consult     Precautions / Restrictions Precautions Precautions: Fall Restrictions Weight Bearing Restrictions: No      Mobility  Bed Mobility Overal bed mobility: Needs Assistance Bed Mobility: Supine to Sit     Supine to sit: Min guard;HOB elevated     General bed mobility comments: Use of rail. no assist needed.  Transfers Overall transfer level: Needs assistance Equipment used: Rolling walker (2 wheeled) Transfers: Sit to/from Stand Sit to Stand: Min assist         General transfer comment: Min A to boost from toilet x1, from EOB x1. Transferred to chair post ambulation bout.  Ambulation/Gait Ambulation/Gait assistance: Min assist Ambulation Distance (Feet): 100 Feet Assistive device: Rolling walker (2 wheeled) Gait Pattern/deviations: Step-through pattern;Decreased stride length;Trunk flexed Gait velocity: decreased   General Gait Details: Slow, mostly steady gait.  Cues for RW management. DOE. HR 114 bpm.  Stairs            Wheelchair Mobility    Modified Rankin (Stroke Patients Only)       Balance Overall balance assessment: Needs assistance;History of Falls Sitting-balance support: Feet supported;No upper extremity supported Sitting balance-Leahy Scale: Good     Standing balance support: During functional activity Standing balance-Leahy Scale: Fair Standing balance comment: Able to stand statically without support but requires UE support for dynamic standing.                             Pertinent Vitals/Pain Pain Assessment: No/denies pain    Home Living Family/patient expects to be discharged to:: Private residence Living Arrangements: Other relatives (grandson but he works during the day) Available Help at Discharge: Family;Available PRN/intermittently Type of Home: House Home Access: Stairs to enter Entrance Stairs-Rails: Right Entrance Stairs-Number of Steps: 5 Home Layout: One level;Laundry or work area in [place]: Environmental consultant - standard      Prior Function Level of Independence: Needs assistance   Gait / Transfers Assistance Needed: Furniture walker for household ambulation.  ADL's / Homemaking Assistance Needed: Reports cooking, cleaning. Reports falls.        Hand Dominance        Extremity/Trunk Assessment   Upper Extremity Assessment: Defer to OT evaluation           Lower Extremity Assessment: Generalized weakness         Communication   Communication: HOH  Cognition Arousal/Alertness: Awake/alert Behavior During Therapy: WFL for tasks assessed/performed Overall Cognitive Status:  Impaired/Different from baseline Area of Impairment: Safety/judgement         Safety/Judgement: Decreased awareness of safety          General Comments      Exercises        Assessment/Plan    PT Assessment Patient needs continued PT services  PT Diagnosis Difficulty  walking;Generalized weakness   PT Problem List Decreased strength;Cardiopulmonary status limiting activity;Decreased activity tolerance;Decreased balance;Decreased mobility;Decreased safety awareness;Decreased knowledge of use of DME  PT Treatment Interventions Balance training;Gait training;Functional mobility training;Therapeutic activities;Therapeutic exercise;Patient/family education;Stair training;DME instruction   PT Goals (Current goals can be found in the Care Plan section) Acute Rehab PT Goals Patient Stated Goal: to go home when I am ready PT Goal Formulation: With patient Time For Goal Achievement: 05/09/16 Potential to Achieve Goals: Good    Frequency Min 3X/week   Barriers to discharge Decreased caregiver support;Inaccessible home environment alone during the day, stairs to enter home    Co-evaluation               End of Session Equipment Utilized During Treatment: Gait belt Activity Tolerance: Patient tolerated treatment well Patient left: in chair;with call bell/phone within reach;with chair alarm set;with SCD's reapplied Nurse Communication: Mobility status         Time: [id number] PT Time Calculation (min) (ACUTE ONLY): 30 min   Charges:   PT Evaluation $PT Eval Moderate Complexity: 1 Procedure PT Treatments $Gait Training: 8-22 mins   PT G Codes:        Kayla Weekes A Mayela Bullard 04/25/2016, 2:42 PM  Wray Kearns, [place], DPT [phone]

## 2016-04-25 NOTE — Progress Notes (Signed)
[facility]                                                                              Patient Demographics  [name], is a 82 y.o. female, DOB - Jun 28, 1928, [id number]  Admit date - 04/23/2016   Admitting Physician Edwin Dada, MD  Outpatient Primary MD for the patient is Charolette Forward, MD  Outpatient specialists:   LOS - 2  days    Chief Complaint  Patient presents with  . Fall  . Head Laceration       Brief summary   Patient is a 82 year old female with NIDDM, HTN, hypothyroidism who presents with weakness for 1 week and dysuria. Patient presented with increasing weakness, fatigue in the last week. On the day of admission had dysuria similar to one month ago when she had a UTI which was treated. In the known she was walking on the grass, tripped and struck the back of her head on the cement which bled hence came to the ED. Otherwise no fevers, chills or any flank pain. In ED she had low-grade temperature, tachycardia to 120, to keep neck. CT head was negative. Received one staple to the head. Lactic acid 5.37, UA positive for UTI. Hemoglobin 7.0. Patient was started on IV antibiotics and transfusion and admitted for further workup.   Assessment & Plan    Principal Problem: Sepsis from UTI:  - Patient met Sirs criteria with tachycardia, tachypnea, UTI, lactic acidosis, leukocytosis. - Continue IV Rocephin until urine cultures blood cultures available - Overall improving, lactic acid down, afebrile, hemodynamically stable   Hyponatremia: Likely due to #1, dehydration - Continue gentle hydration improving  Hypokalemia with hypomagnesemia:  - K Improving, received IV magnesium replacement 5/28   Anemia, normocytic - Normocytic, stool occult test negative, follow SPEP, UPEP - Elevated total protein with anemia, alkaline phosphatase, rule out multiple myeloma - Previous colonoscopy at the age of 22, patient was told that she will  not need any further endoscopies - H&H stable, status post 2 units packed RBCs - LDH within normal limits, haptoglobin pending  Isolated elevated alkaline phosphatase - GGT elevated 348. Outpatient workup has been done as well, patient had abdominal ultrasound done on 5/10 which had shown postcholecystectomy otherwise no acute intra-abdominal pathology/biliary dilatation or obstruction - Repeat alkaline phosphatase trending down, outpatient workup with PCP, may need MRCP to rule out biliary dilatation  pseudoHypocalcemia:  - Corrected calcium with albumin 9.5  Trichomonas:  Seems implausible that pt has STD, but can't rule out true positive -Metronidazole oral 2 g once -Zofran PRN  HTN:  -BPstable, restart beta blocker   Diabetes, NIDDM: -Hold metformin given lactic acidosis - Lactic acid improving, continue sliding scale insulin   Hypothyroidism, post-surgical: -Continue levothyroxine  Fall - Mechanical fall, CT head showed moderate left parietal scalp contusion with associated subcutaneous emphysema suggesting scalp laceration no fractures. - PTOT evaluation  Code Status: DO NOT RESUSCITATE   DVT Prophylaxis:   SCD's   Family Communication: Discussed in detail with the patient, all imaging results, lab results explained to the patient  and family member at the bedside  Disposition Plan:  Time Spent in minutes  25 minutes  Procedures:  CT head  Consultants:   None   Antimicrobials:   IV vanc  1 dose   IV zosyn1 dose  IV Rocephin 5/28    Medications  Scheduled Meds: . sodium chloride   Intravenous Once  . aspirin EC  81 mg Oral QPM  . cefTRIAXone (ROCEPHIN)  IV  1 g Intravenous Q24H  . ferrous sulfate  325 mg Oral Daily  . insulin aspart  0-5 Units Subcutaneous QHS  . insulin aspart  0-9 Units Subcutaneous TID WC  . levothyroxine  112 mcg Oral QAC breakfast  . sodium chloride flush  3 mL Intravenous Q12H   Continuous Infusions:   PRN  Meds:.acetaminophen **OR** acetaminophen, ondansetron **OR** ondansetron (ZOFRAN) IV   Antibiotics   Anti-infectives    Start     Dose/Rate Route Frequency Ordered Stop   04/24/16 1800  vancomycin (VANCOCIN) IVPB 750 mg/150 ml premix  Status:  Discontinued     750 mg 150 mL/hr over 60 Minutes Intravenous Every 24 hours 04/23/16 1809 04/23/16 2120   04/24/16 0315  cefTRIAXone (ROCEPHIN) 2 g in dextrose 5 % 50 mL IVPB  Status:  Discontinued     2 g 100 mL/hr over 30 Minutes Intravenous  Once 04/23/16 2120 04/23/16 2137   04/24/16 0200  piperacillin-tazobactam (ZOSYN) IVPB 3.375 g  Status:  Discontinued     3.375 g 12.5 mL/hr over 240 Minutes Intravenous Every 8 hours 04/23/16 1809 04/23/16 2120   04/24/16 0000  cefTRIAXone (ROCEPHIN) 1 g in dextrose 5 % 50 mL IVPB     1 g 100 mL/hr over 30 Minutes Intravenous Every 24 hours 04/23/16 2137     04/23/16 2200  metroNIDAZOLE (FLAGYL) tablet 2,000 mg     2,000 mg Oral  Once 04/23/16 2120 04/23/16 2213   04/23/16 1715  piperacillin-tazobactam (ZOSYN) IVPB 3.375 g     3.375 g 100 mL/hr over 30 Minutes Intravenous  Once 04/23/16 1708 04/23/16 1904   04/23/16 1715  vancomycin (VANCOCIN) IVPB 1000 mg/200 mL premix     1,000 mg 200 mL/hr over 60 Minutes Intravenous  Once 04/23/16 1708 04/23/16 1904        Subjective:   Doris Gruhn was seen and examined today.  Overall improving, stable. No fevers or chills. Patient denies dizziness, chest pain, shortness of breath, abdominal pain, N/V/D/C, new weakness, numbess, tingling. No acute events overnight.    Objective:   Filed Vitals:   04/24/16 1235 04/24/16 1410 04/24/16 2053 04/25/16 0429  BP: 145/77 151/77 150/85 138/72  Pulse: 81 82 85 76  Temp: 97.6 F (36.4 C) 98.7 F (37.1 C) 97.8 F (36.6 C) 97.7 F (36.5 C)  TempSrc: Oral     Resp: _0 Height:      Weight:      SpO2: 98% 98% 97% 95%    Intake/Output Summary (Last 24 hours) at 04/25/16 1000 Last data filed at  04/25/16 0950  Gross per 24 hour  Intake 2602.5 ml  Output    750 ml  Net 1852.5 ml     Wt Readings from Last 3 Encounters:  04/23/16 59.8 kg (131 lb 13.4 oz)     Exam  General: Alert and oriented x 3, NAD  HEENT:   Neck:   Cardiovascular: S1 S2 auscultated, no rubs, murmurs or gallops. Regular rate and rhythm.  Respiratory: Clear to auscultation bilaterally, no wheezing, rales or rhonchi  Gastrointestinal: Soft, nontender, nondistended, + bowel sounds  Ext: no cyanosis clubbing or edema  Neuro: no new deficits  Skin: No rashes  Psych: Normal affect and demeanor, alert and oriented x3    Data Reviewed:  I have personally reviewed following labs and imaging studies  Micro Results Recent Results (from the past 240 hour(s))  Blood Culture (routine x 2)     Status: None (Preliminary result)   Collection Time: 04/23/16  5:25 PM  Result Value Ref Range Status   Specimen Description BLOOD LEFT HAND  Final   Special Requests BOTTLES DRAWN AEROBIC AND ANAEROBIC 10ML  Final   Culture NO GROWTH < 24 HOURS  Final   Report Status PENDING  Incomplete  Blood Culture (routine x 2)     Status: None (Preliminary result)   Collection Time: 04/23/16  5:30 PM  Result Value Ref Range Status   Specimen Description BLOOD RIGHT ANTECUBITAL  Final   Special Requests BOTTLES DRAWN AEROBIC AND ANAEROBIC 5CC  Final   Culture NO GROWTH < 24 HOURS  Final   Report Status PENDING  Incomplete  Urine culture     Status: Abnormal   Collection Time: 04/23/16  5:47 PM  Result Value Ref Range Status   Specimen Description URINE, CLEAN CATCH  Final   Special Requests NONE  Final   Culture MULTIPLE SPECIES PRESENT, SUGGEST RECOLLECTION (A)  Final   Report Status 04/24/2016 FINAL  Final  MRSA PCR Screening     Status: None   Collection Time: 04/23/16  9:58 PM  Result Value Ref Range Status   MRSA by PCR NEGATIVE NEGATIVE Final    Comment:        The GeneXpert MRSA Assay (FDA approved for  NASAL specimens only), is one component of a comprehensive MRSA colonization surveillance program. It is not intended to diagnose MRSA infection nor to guide or monitor treatment for MRSA infections.     Radiology Reports Dg Chest 2 View  04/23/2016  CLINICAL DATA:  Status post fall backward, hitting head on concrete. Concern for chest injury. Initial encounter. EXAM: CHEST  2 VIEW COMPARISON:  Chest radiograph performed 12/28/2014 FINDINGS: The lungs are well-aerated. Pulmonary vascularity is at the upper limits of normal. Minimal right midlung atelectasis is noted. There is no evidence of pleural effusion or pneumothorax. The heart is normal in size; the mediastinal contour is within normal limits. No acute osseous abnormalities are seen. IMPRESSION: Minimal right midlung atelectasis noted. Lungs otherwise clear. No displaced rib fractures identified. Electronically Signed   By: Garald Balding M.D.   On: 04/23/2016 18:35   Ct Head Wo Contrast  04/23/2016  CLINICAL DATA:  Syncope.  Fall. EXAM: CT HEAD WITHOUT CONTRAST CT CERVICAL SPINE WITHOUT CONTRAST TECHNIQUE: Multidetector CT imaging of the head and cervical spine was performed following the standard protocol without intravenous contrast. Multiplanar CT image reconstructions of the cervical spine were also generated. COMPARISON:  None. FINDINGS: CT HEAD FINDINGS There is a moderate left parietal scalp contusion with associated subcutaneous emphysema. No evidence of parenchymal hemorrhage or extra-axial fluid collection. No mass lesion, mass effect, or midline shift. No CT evidence of acute infarction. Intracranial atherosclerosis. Nonspecific moderate subcortical and periventricular white matter hypodensity, most in keeping with chronic small vessel ischemic change. Generalized cerebral volume loss. No ventriculomegaly. The visualized paranasal sinuses are essentially clear. The mastoid air cells are unopacified. No evidence of calvarial  fracture. CT CERVICAL SPINE FINDINGS No fracture is detected in the cervical spine. No  prevertebral soft tissue swelling. There is straightening of the cervical spine, usually due to positioning and/or muscle spasm. Dens is well positioned between the lateral masses of C1. The lateral masses appear well-aligned. Moderate degenerative disc disease in the lower cervical spine, most prominent at C6-7. Moderate bilateral facet arthropathy. Mild degenerative foraminal stenosis on the right at C5-6 and bilaterally at C6-7. Minimal 2 mm anterolisthesis at C4-5, C5-6 and C7-T1. Visualized mastoid air cells appear clear. No evidence of intra-axial hemorrhage in the visualized brain. No gross cervical canal hematoma. No significant pulmonary nodules at the visualized lung apices. Total thyroidectomy. No cervical adenopathy or other significant neck soft tissue abnormality. IMPRESSION: 1. Moderate left parietal scalp contusion with associated subcutaneous emphysema suggesting associated scalp laceration. 2. No evidence of acute intracranial abnormality. No evidence of calvarial fracture. 3. No cervical spine fracture. 4. Moderate degenerative changes in the cervical spine as described. Minimal multilevel cervical spondylolisthesis, probably degenerative. Electronically Signed   By: Ilona Sorrel M.D.   On: 04/23/2016 19:06   Ct Cervical Spine Wo Contrast  04/23/2016  CLINICAL DATA:  Syncope.  Fall. EXAM: CT HEAD WITHOUT CONTRAST CT CERVICAL SPINE WITHOUT CONTRAST TECHNIQUE: Multidetector CT imaging of the head and cervical spine was performed following the standard protocol without intravenous contrast. Multiplanar CT image reconstructions of the cervical spine were also generated. COMPARISON:  None. FINDINGS: CT HEAD FINDINGS There is a moderate left parietal scalp contusion with associated subcutaneous emphysema. No evidence of parenchymal hemorrhage or extra-axial fluid collection. No mass lesion, mass effect, or midline  shift. No CT evidence of acute infarction. Intracranial atherosclerosis. Nonspecific moderate subcortical and periventricular white matter hypodensity, most in keeping with chronic small vessel ischemic change. Generalized cerebral volume loss. No ventriculomegaly. The visualized paranasal sinuses are essentially clear. The mastoid air cells are unopacified. No evidence of calvarial fracture. CT CERVICAL SPINE FINDINGS No fracture is detected in the cervical spine. No prevertebral soft tissue swelling. There is straightening of the cervical spine, usually due to positioning and/or muscle spasm. Dens is well positioned between the lateral masses of C1. The lateral masses appear well-aligned. Moderate degenerative disc disease in the lower cervical spine, most prominent at C6-7. Moderate bilateral facet arthropathy. Mild degenerative foraminal stenosis on the right at C5-6 and bilaterally at C6-7. Minimal 2 mm anterolisthesis at C4-5, C5-6 and C7-T1. Visualized mastoid air cells appear clear. No evidence of intra-axial hemorrhage in the visualized brain. No gross cervical canal hematoma. No significant pulmonary nodules at the visualized lung apices. Total thyroidectomy. No cervical adenopathy or other significant neck soft tissue abnormality. IMPRESSION: 1. Moderate left parietal scalp contusion with associated subcutaneous emphysema suggesting associated scalp laceration. 2. No evidence of acute intracranial abnormality. No evidence of calvarial fracture. 3. No cervical spine fracture. 4. Moderate degenerative changes in the cervical spine as described. Minimal multilevel cervical spondylolisthesis, probably degenerative. Electronically Signed   By: Ilona Sorrel M.D.   On: 04/23/2016 19:06   US Abdomen Complete  04/06/2016  CLINICAL DATA:  Cholecystectomy. Appendectomy. Elevated alkaline phosphatase. EXAM: ABDOMEN ULTRASOUND COMPLETE COMPARISON:  None. FINDINGS: Gallbladder: Absent Common bile duct: Diameter: 6  mm Liver: No focal lesion identified. Within normal limits in parenchymal echogenicity. IVC: No abnormality visualized. Pancreas: Visualized portion unremarkable. Spleen: Size and appearance within normal limits. Right Kidney: Length: 11.0 cm. Echogenicity within normal limits. No mass or hydronephrosis visualized. Left Kidney: Length: 9.9 cm. No hydronephrosis. Normal cortical echogenicity. Benign-appearing cyst in the upper pole measures 1.2 cm. Abdominal aorta: No  aneurysm visualized. Other findings: None. IMPRESSION: Postcholecystectomy.  No acute intra-abdominal pathology. Electronically Signed   By: Marybelle Killings M.D.   On: 04/06/2016 09:50    Lab Data:  CBC:  Recent Labs Lab 04/23/16 1649 04/24/16 0136 04/24/16 0727 04/24/16 1614 04/25/16 0354  WBC 10.5 12.4* 11.3*  --  8.2  HGB 7.0* 6.0* 7.6* 9.2* 9.4*  HCT 22.4* 18.9* 24.0* 28.2* 28.8*  MCV 79.2 78.1 78.4  --  80.0  PLT 416* 368 389  --  546*   Basic Metabolic Panel:  Recent Labs Lab 04/23/16 1649 04/24/16 0026 04/24/16 0727 04/25/16 0354  NA 129* 130* 133* 132*  K 2.7* 3.3* 3.4* 3.9  CL 95* 99* 102 105  CO2 22 21* 23 22  GLUCOSE 231* 172* 105* 109*  BUN _0 CREATININE 0.99 0.93 0.83 0.80  CALCIUM 7.6* 7.2* 7.5* 7.5*  MG  --   --  1.3*  --    GFR: Estimated Creatinine Clearance: 37.5 mL/min (by C-G formula based on Cr of 0.8). Liver Function Tests:  Recent Labs Lab 04/24/16 0727 04/25/16 0802  AST 60* 58*  ALT 19 16  ALKPHOS 563* 532*  BILITOT 1.5* 1.4*  PROT 8.6* 8.5*  ALBUMIN 1.5* 1.4*   No results for input(s): LIPASE, AMYLASE in the last 168 hours. No results for input(s): AMMONIA in the last 168 hours. Coagulation Profile: No results for input(s): INR, PROTIME in the last 168 hours. Cardiac Enzymes: No results for input(s): CKTOTAL, CKMB, CKMBINDEX, TROPONINI in the last 168 hours. BNP (last 3 results) No results for input(s): PROBNP in the last 8760 hours. HbA1C: No results for  input(s): HGBA1C in the last 72 hours. CBG:  Recent Labs Lab 04/24/16 0814 04/24/16 1242 04/24/16 1627 04/24/16 2130 04/25/16 0758  GLUCAP 106* 116* 122* 136* 100*   Lipid Profile: No results for input(s): CHOL, HDL, LDLCALC, TRIG, CHOLHDL, LDLDIRECT in the last 72 hours. Thyroid Function Tests: No results for input(s): TSH, T4TOTAL, FREET4, T3FREE, THYROIDAB in the last 72 hours. Anemia Panel:  Recent Labs  04/24/16 0026 04/24/16 0727  VITAMINB12  --  >7500*  FOLATE  --  13.2  FERRITIN  --  133  TIBC  --  196*  IRON  --  59  RETICCTPCT 3.7* 3.0   Urine analysis:    Component Value Date/Time   COLORURINE AMBER* 04/23/2016 1747   APPEARANCEUR CLOUDY* 04/23/2016 1747   LABSPEC 1.018 04/23/2016 1747   PHURINE 6.0 04/23/2016 1747   GLUCOSEU NEGATIVE 04/23/2016 1747   HGBUR SMALL* 04/23/2016 1747   BILIRUBINUR SMALL* 04/23/2016 1747   KETONESUR NEGATIVE 04/23/2016 1747   PROTEINUR 30* 04/23/2016 1747   UROBILINOGEN 0.2 05/19/2008 0910   NITRITE NEGATIVE 04/23/2016 1747   LEUKOCYTESUR MODERATE* 04/23/2016 1747     Valeri Sula M.D. [facility] 04/25/2016, 10:00 AM  Pager: [phone] Between 7am to 7pm - call Pager - [phone]  After 7pm go to [email or web address] - password [email or web address]  Call night coverage person covering after 7pm

## 2016-04-26 DIAGNOSIS — A599 Trichomoniasis, unspecified: Secondary | ICD-10-CM | POA: Diagnosis not present

## 2016-04-26 DIAGNOSIS — D509 Iron deficiency anemia, unspecified: Secondary | ICD-10-CM | POA: Diagnosis not present

## 2016-04-26 DIAGNOSIS — A419 Sepsis, unspecified organism: Secondary | ICD-10-CM | POA: Diagnosis not present

## 2016-04-26 DIAGNOSIS — E871 Hypo-osmolality and hyponatremia: Secondary | ICD-10-CM | POA: Diagnosis not present

## 2016-04-26 LAB — BASIC METABOLIC PANEL
ANION GAP: 6 (ref 5–15)
BUN: 11 mg/dL (ref 6–20)
CHLORIDE: 104 mmol/L (ref 101–111)
CO2: 24 mmol/L (ref 22–32)
CREATININE: 0.85 mg/dL (ref 0.44–1.00)
Calcium: 7.9 mg/dL — ABNORMAL LOW (ref 8.9–10.3)
GFR calc non Af Amer: >60 mL/min (ref >60)
Glucose, Bld: 114 mg/dL — ABNORMAL HIGH (ref 65–99)
POTASSIUM: 3.8 mmol/L (ref 3.5–5.1)
SODIUM: 134 mmol/L — AB (ref 135–145)

## 2016-04-26 LAB — CBC
HEMATOCRIT: 31.5 % — AB (ref 36.0–46.0)
HEMOGLOBIN: 10.2 g/dL — AB (ref 12.0–15.0)
MCH: 26.4 pg (ref 26.0–34.0)
MCHC: 32.4 g/dL (ref 30.0–36.0)
MCV: 81.6 fL (ref 78.0–100.0)
Platelets: 445 10*3/uL — ABNORMAL HIGH (ref 150–400)
RBC: 3.86 MIL/uL — AB (ref 3.87–5.11)
RDW: 16.5 % — ABNORMAL HIGH (ref 11.5–15.5)
WBC: 8.2 10*3/uL (ref 4.0–10.5)

## 2016-04-26 LAB — PROTEIN ELECTROPHORESIS, SERUM
A/G Ratio: 0.3 — ABNORMAL LOW (ref 0.7–1.7)
ALBUMIN ELP: 1.8 g/dL — AB (ref 2.9–4.4)
ALPHA-1-GLOBULIN: 0.4 g/dL (ref 0.0–0.4)
Alpha-2-Globulin: 0.7 g/dL (ref 0.4–1.0)
Beta Globulin: 1.3 g/dL (ref 0.7–1.3)
Gamma Globulin: 3.7 g/dL — ABNORMAL HIGH (ref 0.4–1.8)
Globulin, Total: 6.1 g/dL — ABNORMAL HIGH (ref 2.2–3.9)
Total Protein ELP: 7.9 g/dL (ref 6.0–8.5)

## 2016-04-26 LAB — GLUCOSE, CAPILLARY
GLUCOSE-CAPILLARY: 118 mg/dL — AB (ref 65–99)
GLUCOSE-CAPILLARY: 148 mg/dL — AB (ref 65–99)
GLUCOSE-CAPILLARY: 158 mg/dL — AB (ref 65–99)
Glucose-Capillary: 124 mg/dL — ABNORMAL HIGH (ref 65–99)

## 2016-04-26 LAB — HAPTOGLOBIN: HAPTOGLOBIN: 202 mg/dL — AB (ref 34–200)

## 2016-04-26 NOTE — Care Management Important Message (Signed)
Important Message  Patient Details  Name: [name] MRN: [id number] Date of Birth: 08/10/1928   Medicare Important Message Given:  Yes    Loann Quill 04/26/2016, 10:48 AM

## 2016-04-26 NOTE — Progress Notes (Signed)
Physical Therapy Treatment Patient Details Name: [name] MRN: [id number] DOB: [birth year] Today's Date: 04/26/2016    History of Present Illness Patient is a 82 y/o female with hx of HTN, asthma and DM presents with weakness for 1 week and dysuria. In the ED, pt found to have sepsis with UTI. Hemoglobin 7.0. S/p fall hitting head , received 1 staple.    PT Comments    The patient  States that she is alone during the day. Recommend HHPT and a RW even though she may not use it but  States" I'll check with my grandson".  Patient is progressing and did ambulate without the RW to test her balance which is fair to poor unless she holds onto something. Continue PT while in acute  Care.   Follow Up Recommendations  Home health PT;Supervision for mobility/OOB     Equipment Recommendations  Rolling walker with 5" wheels;Other (comment)    Recommendations for Other Services       Precautions / Restrictions Precautions Precautions: Fall    Mobility  Bed Mobility         Supine to sit: Modified independent (Device/Increase time)        Transfers   Equipment used: Rolling walker (2 wheeled) Transfers: Sit to/from Stand Sit to Stand: Supervision         General transfer comment: from bed  Ambulation/Gait Ambulation/Gait assistance: Min guard Ambulation Distance (Feet): 120 Feet (then ambulted x 50' without RW, gait sways a little, tends to hold onto objects, states she does this at home. states a RW may be too tight in her house. ) Assistive device: Rolling walker (2 wheeled) Gait Pattern/deviations: Step-through pattern;Drifts right/left     General Gait Details: Slow, mildly unsteady without UE support.Marland Kitchen  HR 75   Stairs            Wheelchair Mobility    Modified Rankin (Stroke Patients Only)       Balance           Standing balance support: During functional activity;No upper extremity supported Standing balance-Leahy Scale: Fair                      Cognition Arousal/Alertness: Awake/alert Behavior During Therapy: WFL for tasks assessed/performed   Area of Impairment: Corporate treasurer Comments        Pertinent Vitals/Pain Pain Assessment: No/denies pain    Home Living                      Prior Function            PT Goals (current goals can now be found in the care plan section) Progress towards PT goals: Progressing toward goals    Frequency  Min 3X/week    PT Plan Current plan remains appropriate    Co-evaluation             End of Session   Activity Tolerance: Patient tolerated treatment well Patient left: in chair;with chair alarm set     Time: [id number] PT Time Calculation (min) (ACUTE ONLY): 12 min  Charges:  $Gait Training: 8-22 mins                    G Codes:      Claretha Cooper 04/26/2016, 8:33 AM Santiago Glad  [place] [facility] [phone]

## 2016-04-26 NOTE — Progress Notes (Signed)
[facility]                                                                              Patient Demographics  [name], is a 82 y.o. female, DOB - 06/24/1928, AFB:[id number]  Admit date - 04/23/2016   Admitting Physician Edwin Dada, MD  Outpatient Primary MD for the patient is Charolette Forward, MD  Outpatient specialists:   LOS - 3  days    Chief Complaint  Patient presents with  . Fall  . Head Laceration       Brief summary   Patient is a 82 year old female with NIDDM, HTN, hypothyroidism who presents with weakness for 1 week and dysuria. Patient presented with increasing weakness, fatigue in the last week. On the day of admission had dysuria similar to one month ago when she had a UTI which was treated. In the known she was walking on the grass, tripped and struck the back of her head on the cement which bled hence came to the ED. Otherwise no fevers, chills or any flank pain. In ED she had low-grade temperature, tachycardia to 120, to keep neck. CT head was negative. Received one staple to the head. Lactic acid 5.37, UA positive for UTI. Hemoglobin 7.0. Patient was started on IV antibiotics and transfusion and admitted for further workup.   Assessment & Plan    Principal Problem: Sepsis from UTI:  - Patient met Sirs criteria with tachycardia, tachypnea, UTI, lactic acidosis, leukocytosis. - Continue IV Rocephin until urine cultures available, Blood cultures negative, transitioned to oral antibiotics in a.m. - Overall improving, lactic acid down, afebrile, hemodynamically stable   Hyponatremia: Likely due to #1, dehydration - Continue gentle hydration improving   Anemia, normocytic - Normocytic, stool occult test negative, follow SPEP, UPEP - Elevated total protein with anemia, alkaline phosphatase, rule out multiple myeloma - Previous colonoscopy at the age of 70, patient was told that she will not need any further endoscopies - H&H  stable, status post 2 units packed RBCs - LDH within normal limits, haptoglobin 202  Isolated elevated alkaline phosphatase - GGT elevated 348. Obtain antimitochondrial antibodies - Outpatient workup has been done as well, patient had abdominal ultrasound done on 5/10 which had shown postcholecystectomy otherwise no acute intra-abdominal pathology/biliary dilatation or obstruction - Repeat alkaline phosphatase trending down, outpatient workup with PCP  pseudoHypocalcemia:  - Corrected calcium with albumin 9.5  Trichomonas:  Seems implausible that pt has STD, but can't rule out true positive -Metronidazole oral 2 g once -Zofran PRN  HTN:  -BPstable, restart beta blocker   Diabetes, NIDDM: -Hold metformin given lactic acidosis - Lactic acid improving, continue sliding scale insulin   Hypothyroidism, post-surgical: -Continue levothyroxine  Fall - Mechanical fall, CT head showed moderate left parietal scalp contusion with associated subcutaneous emphysema suggesting scalp laceration no fractures. - PTOT evaluation-> home health PT  Code Status: DO NOT RESUSCITATE   DVT Prophylaxis:   SCD's   Family Communication: Discussed in detail with the patient, all imaging results, lab results explained to the patient    Disposition Plan: Hopefully DC home in a.m.  Time Spent in minutes  25 minutes  Procedures:  CT head  Consultants:   None   Antimicrobials:   IV vanc  1 dose   IV zosyn1 dose  IV Rocephin 5/28    Medications  Scheduled Meds: . sodium chloride   Intravenous Once  . aspirin EC  81 mg Oral QPM  . cefTRIAXone (ROCEPHIN)  IV  1 g Intravenous Q24H  . ferrous sulfate  325 mg Oral Daily  . insulin aspart  0-5 Units Subcutaneous QHS  . insulin aspart  0-9 Units Subcutaneous TID WC  . levothyroxine  112 mcg Oral QAC breakfast  . metoprolol succinate  50 mg Oral Daily  . sodium chloride flush  3 mL Intravenous Q12H   Continuous Infusions:   PRN  Meds:.acetaminophen **OR** acetaminophen, ondansetron **OR** ondansetron (ZOFRAN) IV   Antibiotics   Anti-infectives    Start     Dose/Rate Route Frequency Ordered Stop   04/24/16 1800  vancomycin (VANCOCIN) IVPB 750 mg/150 ml premix  Status:  Discontinued     750 mg 150 mL/hr over 60 Minutes Intravenous Every 24 hours 04/23/16 1809 04/23/16 2120   04/24/16 0315  cefTRIAXone (ROCEPHIN) 2 g in dextrose 5 % 50 mL IVPB  Status:  Discontinued     2 g 100 mL/hr over 30 Minutes Intravenous  Once 04/23/16 2120 04/23/16 2137   04/24/16 0200  piperacillin-tazobactam (ZOSYN) IVPB 3.375 g  Status:  Discontinued     3.375 g 12.5 mL/hr over 240 Minutes Intravenous Every 8 hours 04/23/16 1809 04/23/16 2120   04/24/16 0000  cefTRIAXone (ROCEPHIN) 1 g in dextrose 5 % 50 mL IVPB     1 g 100 mL/hr over 30 Minutes Intravenous Every 24 hours 04/23/16 2137     04/23/16 2200  metroNIDAZOLE (FLAGYL) tablet 2,000 mg     2,000 mg Oral  Once 04/23/16 2120 04/23/16 2213   04/23/16 1715  piperacillin-tazobactam (ZOSYN) IVPB 3.375 g     3.375 g 100 mL/hr over 30 Minutes Intravenous  Once 04/23/16 1708 04/23/16 1904   04/23/16 1715  vancomycin (VANCOCIN) IVPB 1000 mg/200 mL premix     1,000 mg 200 mL/hr over 60 Minutes Intravenous  Once 04/23/16 1708 04/23/16 1904        Subjective:   [name] was seen and examined today.  Overall improving, stable. No fevers or chills. Feeling a lot better today, weakness improving. Patient denies dizziness, chest pain, shortness of breath, abdominal pain, N/V/D/C. No acute events overnight.    Objective:   Filed Vitals:   04/24/16 2053 04/25/16 0429 04/25/16 2216 04/26/16 0534  BP: 150/85 138/72 142/72 158/77  Pulse: 85 76 68 65  Temp: 97.8 F (36.6 C) 97.7 F (36.5 C) 97.9 F (36.6 C) 98 F (36.7 C)  TempSrc:   Oral Oral  Resp: _0 Height:      Weight:      SpO2: 97% 95% 99% 98%    Intake/Output Summary (Last 24 hours) at 04/26/16 1003 Last  data filed at 04/26/16 9373  Gross per 24 hour  Intake     50 ml  Output    350 ml  Net   -300 ml     Wt Readings from Last 3 Encounters:  04/23/16 59.8 kg (131 lb 13.4 oz)     Exam  General: Alert and oriented x 3, NAD  HEENT:   Neck:   Cardiovascular: S1 S2 Clear, RRR  Respiratory: CTAB  Gastrointestinal: Soft, nontender, nondistended, + bowel  sounds  Ext: no cyanosis clubbing or edema  Neuro: no new deficits  Skin: No rashes  Psych: Normal affect and demeanor, alert and oriented x3    Data Reviewed:  I have personally reviewed following labs and imaging studies  Micro Results Recent Results (from the past 240 hour(s))  Blood Culture (routine x 2)     Status: None (Preliminary result)   Collection Time: 04/23/16  5:25 PM  Result Value Ref Range Status   Specimen Description BLOOD LEFT HAND  Final   Special Requests BOTTLES DRAWN AEROBIC AND ANAEROBIC 10ML  Final   Culture NO GROWTH 2 DAYS  Final   Report Status PENDING  Incomplete  Blood Culture (routine x 2)     Status: None (Preliminary result)   Collection Time: 04/23/16  5:30 PM  Result Value Ref Range Status   Specimen Description BLOOD RIGHT ANTECUBITAL  Final   Special Requests BOTTLES DRAWN AEROBIC AND ANAEROBIC 5CC  Final   Culture NO GROWTH 2 DAYS  Final   Report Status PENDING  Incomplete  Urine culture     Status: Abnormal   Collection Time: 04/23/16  5:47 PM  Result Value Ref Range Status   Specimen Description URINE, CLEAN CATCH  Final   Special Requests NONE  Final   Culture MULTIPLE SPECIES PRESENT, SUGGEST RECOLLECTION (A)  Final   Report Status 04/24/2016 FINAL  Final  MRSA PCR Screening     Status: None   Collection Time: 04/23/16  9:58 PM  Result Value Ref Range Status   MRSA by PCR NEGATIVE NEGATIVE Final    Comment:        The GeneXpert MRSA Assay (FDA approved for NASAL specimens only), is one component of a comprehensive MRSA colonization surveillance program. It is  not intended to diagnose MRSA infection nor to guide or monitor treatment for MRSA infections.     Radiology Reports Dg Chest 2 View  04/23/2016  CLINICAL DATA:  Status post fall backward, hitting head on concrete. Concern for chest injury. Initial encounter. EXAM: CHEST  2 VIEW COMPARISON:  Chest radiograph performed 12/28/2014 FINDINGS: The lungs are well-aerated. Pulmonary vascularity is at the upper limits of normal. Minimal right midlung atelectasis is noted. There is no evidence of pleural effusion or pneumothorax. The heart is normal in size; the mediastinal contour is within normal limits. No acute osseous abnormalities are seen. IMPRESSION: Minimal right midlung atelectasis noted. Lungs otherwise clear. No displaced rib fractures identified. Electronically Signed   By: Garald Balding M.D.   On: 04/23/2016 18:35   Ct Head Wo Contrast  04/23/2016  CLINICAL DATA:  Syncope.  Fall. EXAM: CT HEAD WITHOUT CONTRAST CT CERVICAL SPINE WITHOUT CONTRAST TECHNIQUE: Multidetector CT imaging of the head and cervical spine was performed following the standard protocol without intravenous contrast. Multiplanar CT image reconstructions of the cervical spine were also generated. COMPARISON:  None. FINDINGS: CT HEAD FINDINGS There is a moderate left parietal scalp contusion with associated subcutaneous emphysema. No evidence of parenchymal hemorrhage or extra-axial fluid collection. No mass lesion, mass effect, or midline shift. No CT evidence of acute infarction. Intracranial atherosclerosis. Nonspecific moderate subcortical and periventricular white matter hypodensity, most in keeping with chronic small vessel ischemic change. Generalized cerebral volume loss. No ventriculomegaly. The visualized paranasal sinuses are essentially clear. The mastoid air cells are unopacified. No evidence of calvarial fracture. CT CERVICAL SPINE FINDINGS No fracture is detected in the cervical spine. No prevertebral soft tissue  swelling. There is straightening of  the cervical spine, usually due to positioning and/or muscle spasm. Dens is well positioned between the lateral masses of C1. The lateral masses appear well-aligned. Moderate degenerative disc disease in the lower cervical spine, most prominent at C6-7. Moderate bilateral facet arthropathy. Mild degenerative foraminal stenosis on the right at C5-6 and bilaterally at C6-7. Minimal 2 mm anterolisthesis at C4-5, C5-6 and C7-T1. Visualized mastoid air cells appear clear. No evidence of intra-axial hemorrhage in the visualized brain. No gross cervical canal hematoma. No significant pulmonary nodules at the visualized lung apices. Total thyroidectomy. No cervical adenopathy or other significant neck soft tissue abnormality. IMPRESSION: 1. Moderate left parietal scalp contusion with associated subcutaneous emphysema suggesting associated scalp laceration. 2. No evidence of acute intracranial abnormality. No evidence of calvarial fracture. 3. No cervical spine fracture. 4. Moderate degenerative changes in the cervical spine as described. Minimal multilevel cervical spondylolisthesis, probably degenerative. Electronically Signed   By: Ilona Sorrel M.D.   On: 04/23/2016 19:06   Ct Cervical Spine Wo Contrast  04/23/2016  CLINICAL DATA:  Syncope.  Fall. EXAM: CT HEAD WITHOUT CONTRAST CT CERVICAL SPINE WITHOUT CONTRAST TECHNIQUE: Multidetector CT imaging of the head and cervical spine was performed following the standard protocol without intravenous contrast. Multiplanar CT image reconstructions of the cervical spine were also generated. COMPARISON:  None. FINDINGS: CT HEAD FINDINGS There is a moderate left parietal scalp contusion with associated subcutaneous emphysema. No evidence of parenchymal hemorrhage or extra-axial fluid collection. No mass lesion, mass effect, or midline shift. No CT evidence of acute infarction. Intracranial atherosclerosis. Nonspecific moderate subcortical and  periventricular white matter hypodensity, most in keeping with chronic small vessel ischemic change. Generalized cerebral volume loss. No ventriculomegaly. The visualized paranasal sinuses are essentially clear. The mastoid air cells are unopacified. No evidence of calvarial fracture. CT CERVICAL SPINE FINDINGS No fracture is detected in the cervical spine. No prevertebral soft tissue swelling. There is straightening of the cervical spine, usually due to positioning and/or muscle spasm. Dens is well positioned between the lateral masses of C1. The lateral masses appear well-aligned. Moderate degenerative disc disease in the lower cervical spine, most prominent at C6-7. Moderate bilateral facet arthropathy. Mild degenerative foraminal stenosis on the right at C5-6 and bilaterally at C6-7. Minimal 2 mm anterolisthesis at C4-5, C5-6 and C7-T1. Visualized mastoid air cells appear clear. No evidence of intra-axial hemorrhage in the visualized brain. No gross cervical canal hematoma. No significant pulmonary nodules at the visualized lung apices. Total thyroidectomy. No cervical adenopathy or other significant neck soft tissue abnormality. IMPRESSION: 1. Moderate left parietal scalp contusion with associated subcutaneous emphysema suggesting associated scalp laceration. 2. No evidence of acute intracranial abnormality. No evidence of calvarial fracture. 3. No cervical spine fracture. 4. Moderate degenerative changes in the cervical spine as described. Minimal multilevel cervical spondylolisthesis, probably degenerative. Electronically Signed   By: Ilona Sorrel M.D.   On: 04/23/2016 19:06   US Abdomen Complete  04/06/2016  CLINICAL DATA:  Cholecystectomy. Appendectomy. Elevated alkaline phosphatase. EXAM: ABDOMEN ULTRASOUND COMPLETE COMPARISON:  None. FINDINGS: Gallbladder: Absent Common bile duct: Diameter: 6 mm Liver: No focal lesion identified. Within normal limits in parenchymal echogenicity. IVC: No abnormality  visualized. Pancreas: Visualized portion unremarkable. Spleen: Size and appearance within normal limits. Right Kidney: Length: 11.0 cm. Echogenicity within normal limits. No mass or hydronephrosis visualized. Left Kidney: Length: 9.9 cm. No hydronephrosis. Normal cortical echogenicity. Benign-appearing cyst in the upper pole measures 1.2 cm. Abdominal aorta: No aneurysm visualized. Other findings: None. IMPRESSION: Postcholecystectomy.  No acute intra-abdominal pathology. Electronically Signed   By: Marybelle Killings M.D.   On: 04/06/2016 09:50    Lab Data:  CBC:  Recent Labs Lab 04/23/16 1649 04/24/16 0136 04/24/16 0727 04/24/16 1614 04/25/16 0354 04/26/16 0750  WBC 10.5 12.4* 11.3*  --  8.2 8.2  HGB 7.0* 6.0* 7.6* 9.2* 9.4* 10.2*  HCT 22.4* 18.9* 24.0* 28.2* 28.8* 31.5*  MCV 79.2 78.1 78.4  --  80.0 81.6  PLT 416* 368 389  --  403* 592*   Basic Metabolic Panel:  Recent Labs Lab 04/23/16 1649 04/24/16 0026 04/24/16 0727 04/25/16 0354 04/26/16 0750  NA 129* 130* 133* 132* 134*  K 2.7* 3.3* 3.4* 3.9 3.8  CL 95* 99* 102 105 104  CO2 22 21* _0 GLUCOSE 231* 172* 105* 109* 114*  BUN _1 CREATININE 0.99 0.93 0.83 0.80 0.85  CALCIUM 7.6* 7.2* 7.5* 7.5* 7.9*  MG  --   --  1.3*  --   --    GFR: Estimated Creatinine Clearance: 35.3 mL/min (by C-G formula based on Cr of 0.85). Liver Function Tests:  Recent Labs Lab 04/24/16 0727 04/25/16 0802  AST 60* 58*  ALT 19 16  ALKPHOS 563* 532*  BILITOT 1.5* 1.4*  PROT 8.6* 8.5*  ALBUMIN 1.5* 1.4*   No results for input(s): LIPASE, AMYLASE in the last 168 hours. No results for input(s): AMMONIA in the last 168 hours. Coagulation Profile: No results for input(s): INR, PROTIME in the last 168 hours. Cardiac Enzymes: No results for input(s): CKTOTAL, CKMB, CKMBINDEX, TROPONINI in the last 168 hours. BNP (last 3 results) No results for input(s): PROBNP in the last 8760 hours. HbA1C: No results for input(s): HGBA1C  in the last 72 hours. CBG:  Recent Labs Lab 04/25/16 0758 04/25/16 1202 04/25/16 1632 04/25/16 2214 04/26/16 0814  GLUCAP 100* 130* 133* 150* 118*   Lipid Profile: No results for input(s): CHOL, HDL, LDLCALC, TRIG, CHOLHDL, LDLDIRECT in the last 72 hours. Thyroid Function Tests: No results for input(s): TSH, T4TOTAL, FREET4, T3FREE, THYROIDAB in the last 72 hours. Anemia Panel:  Recent Labs  04/24/16 0026 04/24/16 0727  VITAMINB12  --  >7500*  FOLATE  --  13.2  FERRITIN  --  133  TIBC  --  196*  IRON  --  59  RETICCTPCT 3.7* 3.0   Urine analysis:    Component Value Date/Time   COLORURINE AMBER* 04/25/2016 2243   APPEARANCEUR CLEAR 04/25/2016 2243   LABSPEC 1.016 04/25/2016 2243   PHURINE 6.5 04/25/2016 2243   GLUCOSEU NEGATIVE 04/25/2016 2243   HGBUR NEGATIVE 04/25/2016 2243   BILIRUBINUR SMALL* 04/25/2016 2243   KETONESUR NEGATIVE 04/25/2016 2243   PROTEINUR NEGATIVE 04/25/2016 2243   UROBILINOGEN 0.2 05/19/2008 0910   NITRITE NEGATIVE 04/25/2016 2243   LEUKOCYTESUR SMALL* 04/25/2016 2243     [name] M.D. [facility] 04/26/2016, 10:03 AM  Pager: [phone] Between 7am to 7pm - call Pager - [phone]  After 7pm go to [email or web address] - password [email or web address]  Call night coverage person covering after 7pm

## 2016-04-27 DIAGNOSIS — A419 Sepsis, unspecified organism: Secondary | ICD-10-CM | POA: Diagnosis not present

## 2016-04-27 DIAGNOSIS — E871 Hypo-osmolality and hyponatremia: Secondary | ICD-10-CM | POA: Diagnosis not present

## 2016-04-27 DIAGNOSIS — E876 Hypokalemia: Secondary | ICD-10-CM | POA: Diagnosis not present

## 2016-04-27 LAB — CBC
HCT: 30.3 % — ABNORMAL LOW (ref 36.0–46.0)
Hemoglobin: 10 g/dL — ABNORMAL LOW (ref 12.0–15.0)
MCH: 26.7 pg (ref 26.0–34.0)
MCHC: 33 g/dL (ref 30.0–36.0)
MCV: 81 fL (ref 78.0–100.0)
PLATELETS: 426 10*3/uL — AB (ref 150–400)
RBC: 3.74 MIL/uL — ABNORMAL LOW (ref 3.87–5.11)
RDW: 16.6 % — ABNORMAL HIGH (ref 11.5–15.5)
WBC: 8.1 10*3/uL (ref 4.0–10.5)

## 2016-04-27 LAB — IMMUNOFIXATION ELECTROPHORESIS
IGG (IMMUNOGLOBIN G), SERUM: 3935 mg/dL — AB (ref 700–1600)
IGM, SERUM: 82 mg/dL (ref 26–217)
IgA: 573 mg/dL — ABNORMAL HIGH (ref 64–422)
TOTAL PROTEIN ELP: 7.6 g/dL (ref 6.0–8.5)

## 2016-04-27 LAB — COMPREHENSIVE METABOLIC PANEL
ALBUMIN: 1.5 g/dL — AB (ref 3.5–5.0)
ALK PHOS: 650 U/L — AB (ref 38–126)
ALT: 17 U/L (ref 14–54)
ANION GAP: 7 (ref 5–15)
AST: 79 U/L — ABNORMAL HIGH (ref 15–41)
BILIRUBIN TOTAL: 1.3 mg/dL — AB (ref 0.3–1.2)
BUN: 8 mg/dL (ref 6–20)
CALCIUM: 7.9 mg/dL — AB (ref 8.9–10.3)
CO2: 23 mmol/L (ref 22–32)
Chloride: 103 mmol/L (ref 101–111)
Creatinine, Ser: 0.89 mg/dL (ref 0.44–1.00)
GFR calc non Af Amer: 57 mL/min — ABNORMAL LOW (ref >60)
GLUCOSE: 121 mg/dL — AB (ref 65–99)
POTASSIUM: 3.8 mmol/L (ref 3.5–5.1)
Sodium: 133 mmol/L — ABNORMAL LOW (ref 135–145)
TOTAL PROTEIN: 8.5 g/dL — AB (ref 6.5–8.1)

## 2016-04-27 LAB — URINE CULTURE

## 2016-04-27 LAB — GLUCOSE, CAPILLARY
GLUCOSE-CAPILLARY: 116 mg/dL — AB (ref 65–99)
GLUCOSE-CAPILLARY: 186 mg/dL — AB (ref 65–99)

## 2016-04-27 LAB — IMMUNOFIXATION, URINE

## 2016-04-27 LAB — MITOCHONDRIAL ANTIBODIES: MITOCHONDRIAL M2 AB, IGG: 34.9 U — AB (ref 0.0–20.0)

## 2016-04-27 MED ORDER — AMLODIPINE-VALSARTAN-HCTZ 5-160-12.5 MG PO TABS: 1.0000 | ORAL_TABLET | Freq: Every day | ORAL | Status: DC

## 2016-04-27 MED ORDER — CIPROFLOXACIN HCL 500 MG PO TABS
250.0000 mg | ORAL_TABLET | Freq: Two times a day (BID) | ORAL | Status: DC
  Administered 2016-04-27: 250 mg via ORAL
  Filled 2016-04-27: qty 1

## 2016-04-27 MED ORDER — CIPROFLOXACIN HCL 250 MG PO TABS: 250.0000 mg | ORAL_TABLET | Freq: Two times a day (BID) | ORAL | Status: DC

## 2016-04-27 MED ORDER — IRBESARTAN 300 MG PO TABS
150.0000 mg | ORAL_TABLET | Freq: Every day | ORAL | Status: DC
  Administered 2016-04-27: 150 mg via ORAL
  Filled 2016-04-27: qty 1

## 2016-04-27 MED ORDER — HYDROCHLOROTHIAZIDE 12.5 MG PO CAPS
12.5000 mg | ORAL_CAPSULE | Freq: Every day | ORAL | Status: DC
  Administered 2016-04-27: 12.5 mg via ORAL
  Filled 2016-04-27: qty 1

## 2016-04-27 MED ORDER — AMLODIPINE BESYLATE 5 MG PO TABS
5.0000 mg | ORAL_TABLET | Freq: Every day | ORAL | Status: DC
  Administered 2016-04-27: 5 mg via ORAL
  Filled 2016-04-27: qty 1

## 2016-04-27 NOTE — Care Management Obs Status (Signed)
[facility] NOTIFICATION   Patient Details  Name: AKEIBA LUBRANO MRN: [id number] Date of Birth: 12/20/1927   Medicare Observation Status Notification Given:  Yes  [place], RN 04/27/2016, 8:25 AM

## 2016-04-27 NOTE — Care Management Obs Status (Signed)
[facility] NOTIFICATION   Patient Details  Name: [name] MRN: [id number] Date of Birth: 11/15/1928   Medicare Observation Status Notification Given:  Yes    Carles Collet, RN 04/27/2016, 8:28 AM

## 2016-04-27 NOTE — Progress Notes (Signed)
PT Cancellation Note  Patient Details Name: [name] MRN: [id number] DOB: 1928/11/03   Cancelled Treatment:    Reason Eval/Treat Not Completed: Patient declined, no reason specifiedPatient preparing to d/c and reported that all PT questions have been answered and has all of the equipment needed for home. Grandson present.    Salina April, PTA Pager: [phone]   04/27/2016, 2:49 PM

## 2016-04-27 NOTE — Discharge Summary (Signed)
Physician Discharge Summary   Patient ID: [name] MRN: [id number] DOB/AGE: 1928-08-10 82 y.o.  Admit date: 04/23/2016 Discharge date: 04/27/2016  Primary Care Physician:  Thressa Sheller, MD  Discharge Diagnoses:    . Sepsis (HCC)Secondary to UTI  . UTI (lower urinary tract infection)   Isolated elevated alkaline phosphatase  . Hyponatremia . Hypokalemia . Hypocalcemia . Microcytic anemia . Trichomonas infection  Consults: None  Recommendations for Outpatient Follow-up:  1. Please follow antimitochondrial antibodies, will need GI referral for possible MRCP and further evaluation of elevated alkaline phosphatase 2. Please repeat CBC/BMET at next visit   DIET: Heart healthy diet    Allergies:  No Known Allergies   DISCHARGE MEDICATIONS: Current Discharge Medication List    START taking these medications   Details  ciprofloxacin (CIPRO) 250 MG tablet Take 1 tablet (250 mg total) by mouth 2 (two) times daily. X 4 more days Qty: 8 tablet, Refills: 0      CONTINUE these medications which have NOT CHANGED   Details  Amlodipine-Valsartan-HCTZ (EXFORGE HCT) 5-160-12.5 MG TABS Take 1 tablet by mouth daily.    aspirin EC 81 MG tablet Take 81 mg by mouth every evening.     ferrous sulfate 325 (65 FE) MG tablet Take 325 mg by mouth daily. Refills: 3    levothyroxine (SYNTHROID, LEVOTHROID) 112 MCG tablet Take 112 mcg by mouth daily before breakfast.    metFORMIN (GLUCOPHAGE) 500 MG tablet Take 500 mg by mouth 2 (two) times daily with a meal.    metoprolol succinate (TOPROL-XL) 50 MG 24 hr tablet Take 50 mg by mouth daily. Take with or immediately following a meal.         Brief H and P: For complete details please refer to admission H and P, but in briefPatient is a 82 year old female with NIDDM, HTN, hypothyroidism who presents with weakness for 1 week and dysuria. Patient presented with increasing weakness, fatigue in the last week. On the day of admission  had dysuria similar to one month ago when she had a UTI which was treated. In the known she was walking on the grass, tripped and struck the back of her head on the cement which bled hence came to the ED. Otherwise no fevers, chills or any flank pain. In ED she had low-grade temperature, tachycardia to 120, to keep neck. CT head was negative. Received one staple to the head. Lactic acid 5.37, UA positive for UTI. Hemoglobin 7.0. Patient was started on IV antibiotics and transfusion and admitted for further workup.  Hospital Course:     Urinary tract infection - Patient met SIRS criteria with tachycardia, tachypnea, UTI, lactic acidosis, leukocytosis. Blood cultures negative to date hence sepsis ruled out.  - Patient was placed on IV Rocephin, urine culture showed multiple species. Patient transitioned to oral ciprofloxacin for 4 more days to complete the full course for 7 days - Overall improving, lactic acid down, afebrile, hemodynamically stable  Hyponatremia: Likely due to #1, dehydration - Patient was placed on IV fluid hydration, improved  Anemia, normocytic - Normocytic, stool occult test negative, SPEP negative for any M spike, UPEP pending - Previous colonoscopy at the age of 65, patient was told that she will not need any further endoscopies -Hemoglobin was 6.0 on 5/28, received 2 units packed RBCs, hemoglobin now has been stable since then, hemoglobin 10.0 at the time of discharge - LDH within normal limits, haptoglobin 202  Isolated elevated alkaline phosphatase - GGT elevated 348. - Outpatient  workup has been started, patient had abdominal ultrasound done on 5/10 which had shown postcholecystectomy otherwise no acute intra-abdominal pathology/biliary dilatation or obstruction -Please follow antimitochondrial antibodies, patient will need outpatient GI referral for possible MRCP  pseudoHypocalcemia:  - Corrected calcium with albumin 9.9  Trichomonas:  Seems implausible  that pt has STD, but can't rule out true positive -Metronidazole oral 2 g given once -Zofran PRN  HTN:  -BPstable, restart beta blocker  Diabetes, NIDDM: - Metformin was held at the time of admission due to lactic acidosis and SIRS.  - Lactic acid has normalized with IV fluid hydration. Patient may restart metformin.  Hypothyroidism, post-surgical: -Continue levothyroxine  Fall - Mechanical fall, CT head showed moderate left parietal scalp contusion with associated subcutaneous emphysema suggesting scalp laceration no fractures. - PTOT evaluation-> home health PT  Day of Discharge BP 162/86 mmHg  Pulse 67  Temp(Src) 97.6 F (36.4 C) (Oral)  Resp 16  Ht '4\' 9"'$  (1.448 m)  Wt 59.8 kg (131 lb 13.4 oz)  BMI 28.52 kg/m2  SpO2 96%  Physical Exam: General: Alert and awake oriented x3 not in any acute distress. HEENT: anicteric sclera, pupils reactive to light and accommodation CVS: S1-S2 clear no murmur rubs or gallops Chest: clear to auscultation bilaterally, no wheezing rales or rhonchi Abdomen: soft nontender, nondistended, normal bowel sounds Extremities: no cyanosis, clubbing or edema noted bilaterally Neuro: Cranial nerves II-XII intact, no focal neurological deficits   The results of significant diagnostics from this hospitalization (including imaging, microbiology, ancillary and laboratory) are listed below for reference.    LAB RESULTS: Basic Metabolic Panel:  Recent Labs Lab 04/24/16 0727  04/26/16 0750 04/27/16 0601  NA 133*  < > 134* 133*  K 3.4*  < > 3.8 3.8  CL 102  < > 104 103  CO2 23  < > 24 23  GLUCOSE 105*  < > 114* 121*  BUN 11  < > 11 8  CREATININE 0.83  < > 0.85 0.89  CALCIUM 7.5*  < > 7.9* 7.9*  MG 1.3*  --   --   --   < > = values in this interval not displayed. Liver Function Tests:  Recent Labs Lab 04/25/16 0802 04/27/16 0601  AST 58* 79*  ALT 16 17  ALKPHOS 532* 650*  BILITOT 1.4* 1.3*  PROT 8.5* 8.5*  ALBUMIN 1.4* 1.5*   No  results for input(s): LIPASE, AMYLASE in the last 168 hours. No results for input(s): AMMONIA in the last 168 hours. CBC:  Recent Labs Lab 04/26/16 0750 04/27/16 0601  WBC 8.2 8.1  HGB 10.2* 10.0*  HCT 31.5* 30.3*  MCV 81.6 81.0  PLT 445* 426*   Cardiac Enzymes: No results for input(s): CKTOTAL, CKMB, CKMBINDEX, TROPONINI in the last 168 hours. BNP: Invalid input(s): POCBNP CBG:  Recent Labs Lab 04/26/16 2118 04/27/16 0758  GLUCAP 158* 116*    Significant Diagnostic Studies:  Dg Chest 2 View  04/23/2016  CLINICAL DATA:  Status post fall backward, hitting head on concrete. Concern for chest injury. Initial encounter. EXAM: CHEST  2 VIEW COMPARISON:  Chest radiograph performed 12/28/2014 FINDINGS: The lungs are well-aerated. Pulmonary vascularity is at the upper limits of normal. Minimal right midlung atelectasis is noted. There is no evidence of pleural effusion or pneumothorax. The heart is normal in size; the mediastinal contour is within normal limits. No acute osseous abnormalities are seen. IMPRESSION: Minimal right midlung atelectasis noted. Lungs otherwise clear. No displaced rib fractures identified. Electronically  Signed   By: Garald Balding M.D.   On: 04/23/2016 18:35   Ct Head Wo Contrast  04/23/2016  CLINICAL DATA:  Syncope.  Fall. EXAM: CT HEAD WITHOUT CONTRAST CT CERVICAL SPINE WITHOUT CONTRAST TECHNIQUE: Multidetector CT imaging of the head and cervical spine was performed following the standard protocol without intravenous contrast. Multiplanar CT image reconstructions of the cervical spine were also generated. COMPARISON:  None. FINDINGS: CT HEAD FINDINGS There is a moderate left parietal scalp contusion with associated subcutaneous emphysema. No evidence of parenchymal hemorrhage or extra-axial fluid collection. No mass lesion, mass effect, or midline shift. No CT evidence of acute infarction. Intracranial atherosclerosis. Nonspecific moderate subcortical and  periventricular white matter hypodensity, most in keeping with chronic small vessel ischemic change. Generalized cerebral volume loss. No ventriculomegaly. The visualized paranasal sinuses are essentially clear. The mastoid air cells are unopacified. No evidence of calvarial fracture. CT CERVICAL SPINE FINDINGS No fracture is detected in the cervical spine. No prevertebral soft tissue swelling. There is straightening of the cervical spine, usually due to positioning and/or muscle spasm. Dens is well positioned between the lateral masses of C1. The lateral masses appear well-aligned. Moderate degenerative disc disease in the lower cervical spine, most prominent at C6-7. Moderate bilateral facet arthropathy. Mild degenerative foraminal stenosis on the right at C5-6 and bilaterally at C6-7. Minimal 2 mm anterolisthesis at C4-5, C5-6 and C7-T1. Visualized mastoid air cells appear clear. No evidence of intra-axial hemorrhage in the visualized brain. No gross cervical canal hematoma. No significant pulmonary nodules at the visualized lung apices. Total thyroidectomy. No cervical adenopathy or other significant neck soft tissue abnormality. IMPRESSION: 1. Moderate left parietal scalp contusion with associated subcutaneous emphysema suggesting associated scalp laceration. 2. No evidence of acute intracranial abnormality. No evidence of calvarial fracture. 3. No cervical spine fracture. 4. Moderate degenerative changes in the cervical spine as described. Minimal multilevel cervical spondylolisthesis, probably degenerative. Electronically Signed   By: Ilona Sorrel M.D.   On: 04/23/2016 19:06   Ct Cervical Spine Wo Contrast  04/23/2016  CLINICAL DATA:  Syncope.  Fall. EXAM: CT HEAD WITHOUT CONTRAST CT CERVICAL SPINE WITHOUT CONTRAST TECHNIQUE: Multidetector CT imaging of the head and cervical spine was performed following the standard protocol without intravenous contrast. Multiplanar CT image reconstructions of the  cervical spine were also generated. COMPARISON:  None. FINDINGS: CT HEAD FINDINGS There is a moderate left parietal scalp contusion with associated subcutaneous emphysema. No evidence of parenchymal hemorrhage or extra-axial fluid collection. No mass lesion, mass effect, or midline shift. No CT evidence of acute infarction. Intracranial atherosclerosis. Nonspecific moderate subcortical and periventricular white matter hypodensity, most in keeping with chronic small vessel ischemic change. Generalized cerebral volume loss. No ventriculomegaly. The visualized paranasal sinuses are essentially clear. The mastoid air cells are unopacified. No evidence of calvarial fracture. CT CERVICAL SPINE FINDINGS No fracture is detected in the cervical spine. No prevertebral soft tissue swelling. There is straightening of the cervical spine, usually due to positioning and/or muscle spasm. Dens is well positioned between the lateral masses of C1. The lateral masses appear well-aligned. Moderate degenerative disc disease in the lower cervical spine, most prominent at C6-7. Moderate bilateral facet arthropathy. Mild degenerative foraminal stenosis on the right at C5-6 and bilaterally at C6-7. Minimal 2 mm anterolisthesis at C4-5, C5-6 and C7-T1. Visualized mastoid air cells appear clear. No evidence of intra-axial hemorrhage in the visualized brain. No gross cervical canal hematoma. No significant pulmonary nodules at the visualized lung apices. Total thyroidectomy.  No cervical adenopathy or other significant neck soft tissue abnormality. IMPRESSION: 1. Moderate left parietal scalp contusion with associated subcutaneous emphysema suggesting associated scalp laceration. 2. No evidence of acute intracranial abnormality. No evidence of calvarial fracture. 3. No cervical spine fracture. 4. Moderate degenerative changes in the cervical spine as described. Minimal multilevel cervical spondylolisthesis, probably degenerative. Electronically  Signed   By: Ilona Sorrel M.D.   On: 04/23/2016 19:06    2D ECHO:   Disposition and Follow-up: Discharge Instructions    Diet Carb Modified    Complete by:  As directed      Increase activity slowly    Complete by:  As directed             DISPOSITION: Home with home health   DISCHARGE FOLLOW-UP Follow-up Information    Follow up with Thressa Sheller, MD On 05/05/2016.   Specialty:  Internal Medicine   Why:  for hospital follow-up, obtain labs LFT's /// Appointment with Dr. Noah Delaine is on 05/05/16 at 11:30am   Contact information:   [place], [place] [address] [phone]       Follow up with [facility].   Why:  for home health rn pt ot and aide   Contact information:   [address] [place] [address] [phone]       Follow up with [facility].   Why:  shower stool to be delivered to room prior to discharge   Contact information:   [address] [place] [address] [phone]        Time spent on Discharge: 35 minutes  Signed:   Natalyah Cummiskey M.D. [facility] 04/27/2016, 12:29 PM Pager: [phone]

## 2016-04-27 NOTE — Progress Notes (Addendum)
Pt's BP has been running high; 162/86,155/77,169/88. MD notified. NP Schorr advised to give the 10am dose of her beta blocker, Lopressor. Will continue to monitor.

## 2016-04-27 NOTE — Care Management Note (Signed)
Case Management Note  Patient Details  Name: [name] MRN: [id number] Date of Birth: February 06, 1928  Subjective/Objective:                 Spoke with patient i nthe room. She states that she will go home with her grandson. She chose [facility] for [facility], and has walker at home. Denies needs for medication assistance. Will get shower stool to room prior to discharge today.    Action/Plan:  DC to home with home health.   Expected Discharge Date:                  Expected Discharge Plan:  [place]  In-House Referral:     Discharge planning Services  CM Consult  Post Acute Care Choice:  Durable Medical Equipment, Home Health Choice offered to:  Patient  DME Arranged:  Shower stool DME Agency:     HH Arranged:  RN, PT, OT, Nurse's Aide [place] Agency:  [facility]  Status of Service:  Completed, signed off  Medicare Important Message Given:  Yes Date Medicare IM Given:    Medicare IM give by:    Date Additional Medicare IM Given:    Additional Medicare Important Message give by:     If discussed at [place] of Stay Meetings, dates discussed:    Additional Comments:  Carles Collet, RN 04/27/2016, 11:41 AM

## 2016-04-28 ENCOUNTER — Encounter (HOSPITAL_COMMUNITY): Payer: Self-pay

## 2016-04-28 ENCOUNTER — Emergency Department (HOSPITAL_COMMUNITY)
Admission: EM | Admit: 2016-04-28 | Discharge: 2016-04-28 | Disposition: A | Discharge status: Home / Self Care | Payer: Medicare Other | Attending: Emergency Medicine | Admitting: Emergency Medicine

## 2016-04-28 DIAGNOSIS — E079 Disorder of thyroid, unspecified: Secondary | ICD-10-CM | POA: Insufficient documentation

## 2016-04-28 DIAGNOSIS — I10 Essential (primary) hypertension: Secondary | ICD-10-CM | POA: Diagnosis not present

## 2016-04-28 DIAGNOSIS — Z7982 Long term (current) use of aspirin: Secondary | ICD-10-CM | POA: Insufficient documentation

## 2016-04-28 DIAGNOSIS — E119 Type 2 diabetes mellitus without complications: Secondary | ICD-10-CM | POA: Insufficient documentation

## 2016-04-28 DIAGNOSIS — Z79899 Other long term (current) drug therapy: Secondary | ICD-10-CM | POA: Diagnosis not present

## 2016-04-28 DIAGNOSIS — Z7984 Long term (current) use of oral hypoglycemic drugs: Secondary | ICD-10-CM | POA: Insufficient documentation

## 2016-04-28 DIAGNOSIS — Z4802 Encounter for removal of sutures: Secondary | ICD-10-CM | POA: Diagnosis present

## 2016-04-28 DIAGNOSIS — J45909 Unspecified asthma, uncomplicated: Secondary | ICD-10-CM | POA: Insufficient documentation

## 2016-04-28 LAB — CULTURE, BLOOD (ROUTINE X 2)
CULTURE: NO GROWTH
CULTURE: NO GROWTH

## 2016-04-28 NOTE — Discharge Instructions (Signed)
Staple Removal, Care After Refer to this sheet in the next few weeks. These instructions provide you with information on caring for yourself after your procedure. Your health care provider may also give you more specific instructions. Your treatment has been planned according to current medical practices, but problems sometimes occur. Call your health care provider if you have any problems or questions after your procedure. WHAT TO EXPECT AFTER THE PROCEDURE After your staples are removed, it is typical to have the following:  Some discomfort and swelling in the wound area.  Slight redness in the area. HOME CARE INSTRUCTIONS   Clean with soap and water  Continue to protect the wound from injury.  Use sunscreen when out in the sun. New scars become sunburned easily. SEEK MEDICAL CARE IF:  You have increasing redness, swelling, or pain in the wound.  You see pus coming from the wound.  You have a fever.  You notice a bad smell coming from the wound or dressing.  Your wound breaks open (edges not staying together).   This information is not intended to replace advice given to you by your health care provider. Make sure you discuss any questions you have with your health care provider.   Document Released: 08/09/2001 Document Revised: 09/04/2013 Document Reviewed: 06/26/2013 Elsevier Interactive Patient Education [employer].

## 2016-04-28 NOTE — ED Provider Notes (Signed)
CSN: [id number]     Arrival date & time 04/28/16  1825 History  By signing my name below, I, Tobe Sos, attest that this documentation has been prepared under the direction and in the presence of Janetta Hora, PA-C.   Electronically Signed: Tobe Sos, ED Scribe. 04/28/2016. 6:52 PM.  Chief Complaint  Patient presents with  . Suture / Staple Removal   The history is provided by the patient. No language interpreter was used.   HPI Comments: [name] is a 82 y.o. female with a PMHx of DM, HTN, Thyroid disease, and Asthma who presents to the Emergency Department for one staple removal in the left posterior occiput s/p fall that occurred 5 day ago. Bleeding is controlled at the site of the wound. Pt was discharged yesterday from the hospital where they placed the staple. Pt reports that she fell over a curve and into the street backwards and struck her head on cement. Pt reports that she feels back at baseline today. Pt denies being on anticoagulants. Pt denies any pain, redness, or drainage at the site of the wound, and also denies HA.     Past Medical History  Diagnosis Date  . Asthma   . Diabetes mellitus without complication ([place])   . Hypertension   . Thyroid disease    Past Surgical History  Procedure Laterality Date  . Appendectomy    . Cholecystectomy    . Thyroid surgery     Family History  Problem Relation Age of Onset  . Cancer Neg Hx   . Diabetes Maternal Grandmother     Diabetic coma  . Hypertension Paternal Grandmother    Social History  Substance Use Topics  . Smoking status: Never Smoker   . Smokeless tobacco: None  . Alcohol Use: No   OB History    No data available     Review of Systems  Constitutional: Negative for fever.  Skin: Positive for wound (left posterior occiput).  Neurological: Negative for headaches.   Allergies  Review of patient's allergies indicates no known allergies.  Home Medications   Prior to Admission medications    Medication Sig Start Date End Date Taking? Authorizing Provider  Amlodipine-Valsartan-HCTZ (EXFORGE HCT) 5-160-12.5 MG TABS Take 1 tablet by mouth daily.    Historical Provider, MD  aspirin EC 81 MG tablet Take 81 mg by mouth every evening.     Historical Provider, MD  ciprofloxacin (CIPRO) 250 MG tablet Take 1 tablet (250 mg total) by mouth 2 (two) times daily. X 4 more days 04/27/16   Ripudeep K Rai, MD  ferrous sulfate 325 (65 FE) MG tablet Take 325 mg by mouth daily. 04/12/16   Historical Provider, MD  levothyroxine (SYNTHROID, LEVOTHROID) 112 MCG tablet Take 112 mcg by mouth daily before breakfast.    Historical Provider, MD  metFORMIN (GLUCOPHAGE) 500 MG tablet Take 500 mg by mouth 2 (two) times daily with a meal.    Historical Provider, MD  metoprolol succinate (TOPROL-XL) 50 MG 24 hr tablet Take 50 mg by mouth daily. Take with or immediately following a meal.    Historical Provider, MD   BP 163/86 mmHg  Pulse 74  Temp(Src) 97.7 F (36.5 C) (Oral)  Resp 18  SpO2 100%   Physical Exam  Constitutional: She is oriented to person, place, and time. She appears well-developed and well-nourished. No distress.  HENT:  Head: Normocephalic and atraumatic.  Eyes: Conjunctivae are normal. Pupils are equal, round, and reactive to light. Right  eye exhibits no discharge. Left eye exhibits no discharge. No scleral icterus.  Neck: Normal range of motion.  Pulmonary/Chest: Effort normal.  Neurological: She is alert and oriented to person, place, and time.  Skin: Skin is warm and dry.  1 staple over left posterior occiput. Dried blood in hair. No drainage or redness  Psychiatric: She has a normal mood and affect.    ED Course  Procedures (including critical care time)  DIAGNOSTIC STUDIES: Oxygen Saturation is 100% on RA, normal by my interpretation.   COORDINATION OF CARE: 6:52 PM-Discussed next steps with pt including staple removal. Pt verbalized understanding and is agreeable with the  plan.   Labs Review Labs Reviewed - No data to display  Imaging Review No results found. I have personally reviewed and evaluated these images and lab results as part of my medical decision-making.   EKG Interpretation None     SUTURE REMOVAL Performed by: Janetta Hora, PA-C. Authorized by: Janetta Hora, PA-C Consent: Verbal consent obtained. Consent given by: Patient. Required items: required blood products, implants, devices, and special equipment available  Time out: Immediately prior to procedure a "time out" was called to verify the correct patient, procedure, equipment, support staff and site/side marked as required. Location: left posterior occiput Wound Appearance: well healed, clean, no drainage, no redness  Staples Removed: 1 Post-removal: No dressing applied Patient tolerance: Patient tolerated the procedure well with no immediate complications.   MDM   Final diagnoses:  Encounter for staple removal   82 year old female who presents for staple removal. 1 staple removed; patient tolerated procedure well. She is afebrile, not tachycardic or tachypneic, and not hypoxic. She is hypertensive; pt has hx of HTN and appears at baseline per chart review. Patient is NAD, non-toxic, with stable VS. Patient is informed of clinical course, understands medical decision making process, and agrees with plan. Opportunity for questions provided and all questions answered. Return precautions given.  I personally performed the services described in this documentation, which was scribed in my presence. The recorded information has been reviewed and is accurate.      Recardo Evangelist, PA-C 04/28/16 [place], MD 04/28/16 [phone]

## 2016-04-28 NOTE — ED Notes (Signed)
PA at bedside.

## 2016-04-28 NOTE — ED Notes (Signed)
Patient able to ambulate independently  

## 2016-04-28 NOTE — ED Notes (Signed)
Staple removal kit at bedside  

## 2016-04-28 NOTE — ED Notes (Signed)
Patient here to have staple removed from scalp, no complaints

## 2016-04-28 NOTE — ED Notes (Signed)
Registration at bedside.

## 2016-05-18 ENCOUNTER — Other Ambulatory Visit: Payer: Self-pay | Admitting: Internal Medicine

## 2016-05-18 DIAGNOSIS — R740 Nonspecific elevation of levels of transaminase and lactic acid dehydrogenase [LDH]: Principal | ICD-10-CM

## 2016-05-18 DIAGNOSIS — R7401 Elevation of levels of liver transaminase levels: Secondary | ICD-10-CM

## 2016-06-03 ENCOUNTER — Ambulatory Visit
Admission: RE | Admit: 2016-06-03 | Discharge: 2016-06-03 | Disposition: A | Discharge status: Home / Self Care | Payer: Medicare Other | Source: Ambulatory Visit | Attending: Internal Medicine | Admitting: Internal Medicine

## 2016-06-03 DIAGNOSIS — R740 Nonspecific elevation of levels of transaminase and lactic acid dehydrogenase [LDH]: Principal | ICD-10-CM

## 2016-06-03 DIAGNOSIS — R7401 Elevation of levels of liver transaminase levels: Secondary | ICD-10-CM

## 2016-06-03 MED ORDER — IOPAMIDOL (ISOVUE-300) INJECTION 61%
100.0000 mL | Freq: Once | INTRAVENOUS | Status: AC | PRN
  Administered 2016-06-03: 100 mL via INTRAVENOUS

## 2016-06-10 ENCOUNTER — Encounter: Payer: Self-pay | Admitting: Hematology

## 2016-06-10 ENCOUNTER — Telehealth: Payer: Self-pay | Admitting: Hematology

## 2016-06-10 NOTE — Telephone Encounter (Signed)
Appointment scheduled with the patient's grandson with Irene Limbo on 7/27 at [facility]. Demographics verified. Letter to referring.

## 2016-06-23 ENCOUNTER — Telehealth: Payer: Self-pay | Admitting: Hematology

## 2016-06-23 ENCOUNTER — Ambulatory Visit (HOSPITAL_BASED_OUTPATIENT_CLINIC_OR_DEPARTMENT_OTHER): Payer: Medicare Other

## 2016-06-23 ENCOUNTER — Encounter: Payer: Self-pay | Admitting: Hematology

## 2016-06-23 ENCOUNTER — Ambulatory Visit (HOSPITAL_BASED_OUTPATIENT_CLINIC_OR_DEPARTMENT_OTHER): Payer: Medicare Other | Admitting: Hematology

## 2016-06-23 VITALS — BP 141/67 | HR 93 | Temp 98.0°F | Resp 18 | Ht <= 58 in | Wt 125.8 lb

## 2016-06-23 DIAGNOSIS — R634 Abnormal weight loss: Secondary | ICD-10-CM

## 2016-06-23 DIAGNOSIS — R748 Abnormal levels of other serum enzymes: Secondary | ICD-10-CM

## 2016-06-23 DIAGNOSIS — D892 Hypergammaglobulinemia, unspecified: Secondary | ICD-10-CM | POA: Diagnosis not present

## 2016-06-23 DIAGNOSIS — R590 Localized enlarged lymph nodes: Secondary | ICD-10-CM

## 2016-06-23 DIAGNOSIS — D472 Monoclonal gammopathy: Secondary | ICD-10-CM

## 2016-06-23 DIAGNOSIS — D649 Anemia, unspecified: Secondary | ICD-10-CM | POA: Diagnosis not present

## 2016-06-23 DIAGNOSIS — R599 Enlarged lymph nodes, unspecified: Secondary | ICD-10-CM

## 2016-06-23 DIAGNOSIS — K869 Disease of pancreas, unspecified: Secondary | ICD-10-CM

## 2016-06-23 DIAGNOSIS — E8809 Other disorders of plasma-protein metabolism, not elsewhere classified: Secondary | ICD-10-CM

## 2016-06-23 LAB — COMPREHENSIVE METABOLIC PANEL
ALT: 20 U/L (ref 0–55)
ANION GAP: 8 meq/L (ref 3–11)
AST: 76 U/L — ABNORMAL HIGH (ref 5–34)
Albumin: 1.8 g/dL — ABNORMAL LOW (ref 3.5–5.0)
Alkaline Phosphatase: 764 U/L — ABNORMAL HIGH (ref 40–150)
BUN: 18.4 mg/dL (ref 7.0–26.0)
CALCIUM: 8.5 mg/dL (ref 8.4–10.4)
CHLORIDE: 98 meq/L (ref 98–109)
CO2: 26 meq/L (ref 22–29)
Creatinine: 1 mg/dL (ref 0.6–1.1)
EGFR: 60 mL/min/{1.73_m2} — AB (ref >90)
Glucose: 150 mg/dl — ABNORMAL HIGH (ref 70–140)
POTASSIUM: 3.7 meq/L (ref 3.5–5.1)
Sodium: 132 mEq/L — ABNORMAL LOW (ref 136–145)
Total Bilirubin: 1.59 mg/dL — ABNORMAL HIGH (ref 0.20–1.20)
Total Protein: 10 g/dL — ABNORMAL HIGH (ref 6.4–8.3)

## 2016-06-23 LAB — CBC & DIFF AND RETIC
BASO%: 0.9 % (ref 0.0–2.0)
Basophils Absolute: 0.1 10*3/uL (ref 0.0–0.1)
EOS%: 7.4 % — AB (ref 0.0–7.0)
Eosinophils Absolute: 0.6 10*3/uL — ABNORMAL HIGH (ref 0.0–0.5)
HEMATOCRIT: 26.9 % — AB (ref 34.8–46.6)
HGB: 8.7 g/dL — ABNORMAL LOW (ref 11.6–15.9)
Immature Retic Fract: 6.2 % (ref 1.60–10.00)
LYMPH%: 24.2 % (ref 14.0–49.7)
MCH: 25.3 pg (ref 25.1–34.0)
MCHC: 32.3 g/dL (ref 31.5–36.0)
MCV: 78.2 fL — AB (ref 79.5–101.0)
MONO#: 0.7 10*3/uL (ref 0.1–0.9)
MONO%: 8.9 % (ref 0.0–14.0)
NEUT%: 58.6 % (ref 38.4–76.8)
NEUTROS ABS: 4.6 10*3/uL (ref 1.5–6.5)
PLATELETS: 371 10*3/uL (ref 145–400)
RBC: 3.44 10*6/uL — AB (ref 3.70–5.45)
RDW: 14.6 % — ABNORMAL HIGH (ref 11.2–14.5)
RETIC CT ABS: 107.33 10*3/uL — AB (ref 33.70–90.70)
Retic %: 3.12 % — ABNORMAL HIGH (ref 0.70–2.10)
WBC: 7.8 10*3/uL (ref 3.9–10.3)
lymph#: 1.9 10*3/uL (ref 0.9–3.3)
nRBC: 0 % (ref 0–0)

## 2016-06-23 LAB — CHCC SMEAR

## 2016-06-23 LAB — LACTATE DEHYDROGENASE: LDH: 175 U/L (ref 125–245)

## 2016-06-23 NOTE — Telephone Encounter (Signed)
Gave pt cal & avs °

## 2016-06-24 LAB — BETA 2 MICROGLOBULIN, SERUM: BETA 2: 4.3 mg/L — AB (ref 0.6–2.4)

## 2016-06-24 LAB — VITAMIN B12: Vitamin B12: >2000 pg/mL — ABNORMAL HIGH (ref 211–946)

## 2016-06-24 LAB — CANCER ANTIGEN 19-9: CAN 19-9: 309 U/mL — AB (ref 0–35)

## 2016-06-24 LAB — SEDIMENTATION RATE

## 2016-06-24 LAB — FERRITIN: Ferritin: 159 ng/ml (ref 9–269)

## 2016-06-24 LAB — CEA: CEA: 4.6 ng/mL (ref 0.0–4.7)

## 2016-06-24 LAB — KAPPA/LAMBDA LIGHT CHAINS
IG KAPPA FREE LIGHT CHAIN: 301 mg/L — AB (ref 3.3–19.4)
IG LAMBDA FREE LIGHT CHAIN: 176.3 mg/L — AB (ref 5.7–26.3)
KAPPA/LAMBDA FLC RATIO: 1.71 — AB (ref 0.26–1.65)

## 2016-06-24 LAB — ERYTHROPOIETIN: Erythropoietin: 15.3 m[IU]/mL (ref 2.6–18.5)

## 2016-06-27 ENCOUNTER — Other Ambulatory Visit: Payer: Self-pay | Admitting: Hematology

## 2016-06-27 DIAGNOSIS — D472 Monoclonal gammopathy: Secondary | ICD-10-CM

## 2016-06-28 LAB — UPEP/TP, 24-HR URINE
ALPHA-2-GLOBULIN, U: 10.2 %
Albumin, U: 37.9 %
Alpha-1-Globulin, U: 6.7 %
Beta Globulin, U: 15 %
Gamma Globulin, U: 30.2 %
PROTEIN 24H UR: 209 mg/(24.h) — AB (ref 30–150)
PROTEIN UR: 59.7 mg/dL

## 2016-06-28 LAB — MULTIPLE MYELOMA PANEL, SERUM
ALBUMIN/GLOB SERPL: 0.4 — AB (ref 0.7–1.7)
ALPHA2 GLOB SERPL ELPH-MCNC: 0.9 g/dL (ref 0.4–1.0)
Albumin SerPl Elph-Mcnc: 2.2 g/dL — ABNORMAL LOW (ref 2.9–4.4)
Alpha 1: 0.4 g/dL (ref 0.0–0.4)
B-Globulin SerPl Elph-Mcnc: 1.4 g/dL — ABNORMAL HIGH (ref 0.7–1.3)
Gamma Glob SerPl Elph-Mcnc: 4.2 g/dL — ABNORMAL HIGH (ref 0.4–1.8)
Globulin, Total: 6.8 g/dL — ABNORMAL HIGH (ref 2.2–3.9)
IGA/IMMUNOGLOBULIN A, SERUM: 741 mg/dL — AB (ref 64–422)
IGM (IMMUNOGLOBIN M), SRM: 104 mg/dL (ref 26–217)
Total Protein: 9 g/dL — ABNORMAL HIGH (ref 6.0–8.5)

## 2016-06-28 NOTE — Progress Notes (Signed)
Marland Kitchen    HEMATOLOGY/ONCOLOGY CONSULTATION NOTE  Date of Service: 06/23/2016  Patient Care Team: Thressa Sheller, MD as PCP - General (Internal Medicine)  CHIEF COMPLAINTS/PURPOSE OF CONSULTATION:  Newly diagnosed anemia "Evaluation for leukemia"  HISTORY OF PRESENTING ILLNESS:  [name] is a wonderful 82 y.o. female who has been referred to [place] by Dr .Thressa Sheller, MD  for evaluation and management of "Possible leukemia"  Patient with history of hypertension, diabetes, chronic kidney disease stage III, vitamin D deficiency, hypothyroidism Who presented to the emergency room on 04/23/2016 with 1 week history of dysuria and increasing weakness and fatigue. She had also been walking and [place] trip and struck the back of her head on the cement leading to a laceration that was bleeding. She is what brought her to the emergency room where she was found to have low-grade fevers and tachycardia to 120. CT head was negative. Patient had staples to her head laceration. Workup showed urinary tract infection and a new anemia with a hemoglobin of 7 and elevated lactic acid of 5.37. In the hospital the patient was noted to have signs of sepsis due to her urinary tract infection and was treated with IV ceftriaxone followed by oral ciprofloxacin as discharge. She was noted to have hyponatremia due to dehydration which improved.Her normocytic anemia showed hemoglobin as low as 6 and she was worked up stool occult testing was negative. SPEP was negative for an M spike. LDH and haptoglobin within normal limits. Received 2 units of PRBCs after which a hemoglobin was noted to be stable. Previous colonoscopy was at age 48 after which she that she didn't need any further screening. Pap smear was done long time ago. Last mammogram was in 2005.  She was also noted to have an isolated elevated alkaline phosphatase. She had an ultrasound of the abdomen as outpatient on 04/06/2016 which showed post cholecystectomy  status with no acute intra-abdominal pathology. No biliary dilatation noted. Also noted to have Trichomonas -and received metronidazole.   Patient was subsequently discharged home.  Patient has subsequently had a CT of the abdomen and pelvis with Dr. Noah Delaine which showed mild intrahepatic biliary dilatation without any obstructing mass. 2 cystic structures in the pancreatic head. Nonspecific left periaortic enlarged lymph node measuring 12 mm. Enlarged right pericardial lymph node along the right anterior aspect measuring 12.5 mm - ?reactive vs neoplastic.  Patient has reported drop in her weight from 160 to 115 pounds over the last several months. She also reports increasing lower extremity edema associated with hypoalbuminemia. She has been working with physical therapy.  Patient has had increasing alkaline phosphatase levels, bilirubin elevated to 2.6 and AST of 101 on 05/18/2016. GGT was also elevated to 769.   MEDICAL HISTORY:  Past Medical History:  Diagnosis Date  . Asthma   . Diabetes mellitus without complication ([place])   . Hypertension   . Thyroid disease   Chronic kidney disease stage III Vitamin D deficiency Hypothyroidism on replacement for about 10 years   SURGICAL HISTORY: Past Surgical History:  Procedure Laterality Date  . APPENDECTOMY    . CHOLECYSTECTOMY    . THYROID SURGERY      SOCIAL HISTORY: Social History   Social History  . Marital status: Widowed    Spouse name: N/A  . Number of children: N/A  . Years of education: N/A   Occupational History  . Not on file.   Social History Main Topics  . Smoking status: Never Smoker  . Smokeless tobacco:  Not on file  . Alcohol use No  . Drug use: No  . Sexual activity: No   Other Topics Concern  . Not on file   Social History Narrative  . No narrative on file    FAMILY HISTORY: Family History  Problem Relation Age of Onset  . Diabetes Maternal Grandmother     Diabetic coma  . Hypertension  Paternal Grandmother   . Cancer Neg Hx     ALLERGIES:  has No Known Allergies.  MEDICATIONS:  Current Outpatient Prescriptions  Medication Sig Dispense Refill  . Amlodipine-Valsartan-HCTZ (EXFORGE HCT) 5-160-12.5 MG TABS Take 1 tablet by mouth daily.    Marland Kitchen aspirin EC 81 MG tablet Take 81 mg by mouth every evening.     . ciprofloxacin (CIPRO) 250 MG tablet Take 1 tablet (250 mg total) by mouth 2 (two) times daily. X 4 more days 8 tablet 0  . ferrous sulfate 325 (65 FE) MG tablet Take 325 mg by mouth daily.  3  . levothyroxine (SYNTHROID, LEVOTHROID) 112 MCG tablet Take 112 mcg by mouth daily before breakfast.    . metFORMIN (GLUCOPHAGE) 500 MG tablet Take 500 mg by mouth 2 (two) times daily with a meal.    . metoprolol succinate (TOPROL-XL) 50 MG 24 hr tablet Take 50 mg by mouth daily. Take with or immediately following a meal.     No current facility-administered medications for this visit.     REVIEW OF SYSTEMS:    10 Point review of Systems was done is negative except as noted above.  PHYSICAL EXAMINATION: ECOG PERFORMANCE STATUS: 3 - Symptomatic, >50% confined to bed  . Vitals:   06/23/16 1436  BP: (!) 141/67  Pulse: 93  Resp: 18  Temp: 98 F (36.7 C)   Filed Weights   06/23/16 1436  Weight: 125 lb 12.8 oz (57.1 kg)   .Body mass index is 27.22 kg/m.  GENERAL:alert,Elderly African-American female  in no acute distress and comfortable SKIN: skin color, texture, turgor are normal, no rashes or significant lesions EYES: normal, conjunctiva are pink and non-injected, sclera clear OROPHARYNX:no exudate, no erythema and lips, buccal mucosa, and tongue normal  NECK: supple, no JVD LYMPH:  no palpable lymphadenopathy in the cervical, axillary or inguinal LUNGS: clear to auscultation with normal respiratory effort HEART: regular rate & rhythm,  no murmurs and bilateral lower extremity 2+ pitting pedal edema  ABDOMEN: abdomen soft, non-tender, normoactive bowel sounds    PSYCH: alert & oriented x 3 with fluent speech NEURO: no focal motor/sensory deficits  LABORATORY DATA:  I have reviewed the data as listed  . CBC Latest Ref Rng & Units 06/23/2016 04/27/2016 04/26/2016  WBC 3.9 - 10.3 10e3/uL 7.8 8.1 8.2  Hemoglobin 11.6 - 15.9 g/dL 8.7(L) 10.0(L) 10.2(L)  Hematocrit 34.8 - 46.6 % 26.9(L) 30.3(L) 31.5(L)  Platelets 145 - 400 10e3/uL 371 426(H) 445(H)    . CMP Latest Ref Rng & Units 06/23/2016 04/27/2016 04/26/2016  Glucose 70 - 140 mg/dl 150(H) 121(H) 114(H)  BUN 7.0 - 26.0 mg/dL 18.'4 8 11  '$ Creatinine 0.6 - 1.1 mg/dL 1.0 0.89 0.85  Sodium 136 - 145 mEq/L 132(L) 133(L) 134(L)  Potassium 3.5 - 5.1 mEq/L 3.7 3.8 3.8  Chloride 101 - 111 mmol/L - 103 104  CO2 22 - 29 mEq/L '26 23 24  '$ Calcium 8.4 - 10.4 mg/dL 8.5 7.9(L) 7.9(L)  Total Protein 6.4 - 8.3 g/dL 10.0(H) 8.5(H) -  Total Bilirubin 0.20 - 1.20 mg/dL 1.59(H) 1.3(H) -  Alkaline Phos 40 - 150 U/L 764(H) 650(H) -  AST 5 - 34 U/L 76(H) 79(H) -  ALT 0 - 55 U/L 20 17 -   Albumin 1.8  Component     Latest Ref Rng & Units 04/24/2016 04/24/2016 04/24/2016 04/24/2016         7:27 AM 10:55 AM  4:14 PM  4:14 PM  Total Protein ELP     6.0 - 8.5 g/dL   7.9 7.6  Albumin ELP     2.9 - 4.4 g/dL   1.8 (L)   CAVQS-9-OWSVXXIO     0.0 - 0.4 g/dL   0.4   YBBUB-3-GIEODDYH     0.4 - 1.0 g/dL   0.7   Beta Globulin     0.7 - 1.3 g/dL   1.3   Gamma Globulin     0.4 - 1.8 g/dL   3.7 (H)   M-SPIKE, %     Not Observed g/dL   Not Observed   SPE Interp.        Comment   Comment        Comment   Globulin, Total     2.2 - 3.9 g/dL   6.1 (H)   A/G Ratio     0.7 - 1.7   0.3 (L)   IgG (Immunoglobin G), Serum     700 - 1,600 mg/dL    8,654 (H)  IgA     64 - 422 mg/dL    561 (H)  IgM, Serum     26 - 217 mg/dL    82  Immunofixation Result, Serum         Comment  Fecal Occult Blood, POC     NEGATIVE  NEGATIVE    Ferritin     11 - 307 ng/mL 133     Immunofixation, Urine           Vitamin B12     211 - 946  pg/mL       Component     Latest Ref Rng & Units 04/25/2016 06/23/2016           Total Protein ELP     6.0 - 8.5 g/dL    Albumin ELP     2.9 - 4.4 g/dL    FILXF-3-GQXZYSUW     0.0 - 0.4 g/dL    UGWON-5-HSIMXGVM     0.4 - 1.0 g/dL    Beta Globulin     0.7 - 1.3 g/dL    Gamma Globulin     0.4 - 1.8 g/dL    M-SPIKE, %     Not Observed g/dL    SPE Interp.         Comment         Globulin, Total     2.2 - 3.9 g/dL    A/G Ratio     0.7 - 1.7    IgG (Immunoglobin G), Serum     700 - 1,600 mg/dL    IgA     64 - 982 mg/dL    IgM, Serum     26 - 217 mg/dL    Immunofixation Result, Serum         Fecal Occult Blood, POC     NEGATIVE    Ferritin     11 - 307 ng/mL    Immunofixation, Urine      Comment   Vitamin B12     211 - 946 pg/mL  >2000 (H)   Immunofixation electrophoresis  The value has a corrected status.      No reference range information available      Comments:           (NOTE)           An apparent polyclonal gammopathy: IgG and IgA. Kappa and            lambda           typing appear increased.  Immunofixation, urine      No reference range information available      Comments:           (NOTE)           An apparent normal immunofixation pattern.           Performed At: [facility]           [place], [place] [id number]           Lindon Romp MD HQ:[id number]   RADIOGRAPHIC STUDIES: I have personally reviewed the radiological images as listed and agreed with the findings in the report. Ct Abdomen Pelvis W Contrast  Result Date: 06/03/2016 CLINICAL DATA:  Transaminitis. Loss of appetite. Weight loss of 35 pounds. EXAM: CT ABDOMEN AND PELVIS WITH CONTRAST TECHNIQUE: Multidetector CT imaging of the abdomen and pelvis was performed using the standard protocol following bolus administration of intravenous contrast. CONTRAST:  176m ISOVUE-300 IOPAMIDOL (ISOVUE-300) INJECTION 61% COMPARISON:  None. FINDINGS: Lower chest: Lung  bases are clear. Enlarged heart size. Enlarged right pericardial lymph node along the right anterior aspect measuring 12.5 mm in short axis. Hepatobiliary: No hepatic mass. Mild intrahepatic biliary ductal dilatation. Prior cholecystectomy. Pancreas: 9 x 15 mm hypodense, fluid attenuating pancreatic head mass likely representing a cyst. Smaller adjacent 13 x 8 mm cystic structure in the pancreatic head likely representing a cyst. No solid pancreatic mass. Spleen: Normal spleen Adrenals/Urinary Tract: Normal adrenal glands. Normal kidneys. No obstructive uropathy. Normal bladder. Stomach/Bowel: No bowel dilatation or bowel wall thickening. No pneumatosis, pneumoperitoneum or portal venous gas. Vascular/Lymphatic: Normal normal caliber abdominal aorta with atherosclerosis. Left periaortic enlarged lymph node measuring 12 mm in short axis. Reproductive: Normal uterus.  No adnexal mass. Other: No fluid collection or hematoma. Musculoskeletal: No acute osseous abnormality. No lytic or sclerotic osseous lesion. Lower lumbar spine spondylosis. IMPRESSION: 1. Mild intrahepatic biliary ductal dilatation without an obstructing mass. This is likely sequela of secondary to prior cholecystectomy. 2. Two cystic structures in the pancreatic head likely reflecting pancreatic cysts. 3. Nonspecific left periaortic enlarged lymph node measuring 12 mm in short axis. Enlarged right pericardial lymph node along the right anterior aspect measuring 12.5 mm in short axis. These are of uncertain etiology and may be reactive. Alternatively these can be seen in the setting of lymphoproliferative disease is. Correlation with laboratory values is recommended. 4.  Aortic Atherosclerosis (ICD10-170.0) Electronically Signed   By: HKathreen Devoid  On: 06/03/2016 11:48    ASSESSMENT & PLAN:   82year old African American female with  #1 normocytic normochromic anemia LDH and haptoglobin levels done previously within normal limits Other than  some acute blood loss due to her scalp laceration in May 2017 she has no other overt evidence of bleeding and had a fecal occult blood testing in the hospital which was negative. Ferritin levels 133 B12 levels within normal limits In the setting of hypergammaglobinemia would need to rule out the presence of an inflammatory disorder, malignancy or multiple myeloma.  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 Kappa and lambda typing appear increased.  #3 abnormal liver function tests with pancreatic lesions and small abdominal adenopathy and obstructive-appearing LFTs with elevated GGT and alkaline phosphatase. Significant weight loss and anemia. Low albumin levels suggesting hyper catabolic state and /or poor oral intake PLAN -We'll repeat myeloma workup including SPEP with quantitative immunoglobulins and IFE, serum free light chains, LDH, sedimentation rate, beta-2 microglobulin Ferritin and B12 -Peripheral blood smear -PET/CT scan to evaluate for bone lesions, abdominal adenopathy -CEA and CA-19-9 levels in the setting of pancreatic lesions and obstructive LFT picture. -Might need to do a bone marrow biopsy depending on initial workup. -Might need a gastroenterology evaluation for abnormal LFTs and biliary dilatation.   Return to care with Dr. Irene Limbo in 2 weeks with above workup results. . Orders Placed This Encounter  Procedures  . NM PET Image Initial (PI) Skull Base To Thigh    Standing Status:   Future    Number of Occurrences:   1    Standing Expiration Date:   07/28/2017    Order Specific Question:   Reason for exam:    Answer:   Abdominal Lnadenopathy with weight loss and anemia + Paraproteinemia -- concerning for malignancy/lymphoma/plasma cell dyscrasia    Order Specific Question:   Preferred imaging location?    Answer:   [facility]  . CBC & Diff and Retic    Standing Status:   Future    Number of Occurrences:   1    Standing Expiration  Date:   06/23/2017  . Comprehensive metabolic panel    Standing Status:   Future    Number of Occurrences:   1    Standing Expiration Date:   06/23/2017  . Smear    Standing Status:   Future    Number of Occurrences:   1    Standing Expiration Date:   06/23/2017  . Lactate dehydrogenase (LDH)    Standing Status:   Future    Number of Occurrences:   1    Standing Expiration Date:   06/23/2017  . Multiple Myeloma Panel (SPEP&IFE w/QIG)    Standing Status:   Future    Number of Occurrences:   1    Standing Expiration Date:   06/23/2017  . Kappa/lambda light chains    Standing Status:   Future    Number of Occurrences:   1    Standing Expiration Date:   07/28/2017  . Sedimentation rate    Standing Status:   Future    Number of Occurrences:   1    Standing Expiration Date:   06/23/2017  . Beta 2 microglobulin    Standing Status:   Future    Number of Occurrences:   1    Standing Expiration Date:   06/23/2017  . Ferritin    Standing Status:   Future    Number of Occurrences:   1    Standing Expiration Date:   06/23/2017  . Erythropoietin    Standing Status:   Future    Number of Occurrences:   1    Standing Expiration Date:   06/23/2017  . Cancer antigen 19-9    Standing Status:   Future    Number of Occurrences:   1    Standing Expiration Date:   07/28/2017  . CEA    Standing Status:   Future    Number of Occurrences:   1    Standing Expiration Date:  06/23/2017  . Vitamin B12    Standing Status:   Future    Number of Occurrences:   1    Standing Expiration Date:   06/23/2017     All of the patients questions were answered with apparent satisfaction. The patient knows to call the clinic with any problems, questions or concerns.  I spent 70 minutes counseling the patient face to face. The total time spent in the appointment was 80 minutes and more than 50% was on counseling and direct patient cares.    Sullivan Lone MD [place] AAHIVMS [facility] Hematology/Oncology Physician [facility]  (Office):       [phone] (Work cell):  [phone] (Fax):           [phone]

## 2016-06-30 ENCOUNTER — Encounter (HOSPITAL_COMMUNITY)
Admission: RE | Admit: 2016-06-30 | Discharge: 2016-06-30 | Disposition: A | Discharge status: Home / Self Care | Payer: Medicare Other | Source: Ambulatory Visit | Attending: Hematology | Admitting: Hematology

## 2016-06-30 DIAGNOSIS — E8809 Other disorders of plasma-protein metabolism, not elsewhere classified: Secondary | ICD-10-CM | POA: Diagnosis present

## 2016-06-30 DIAGNOSIS — R634 Abnormal weight loss: Secondary | ICD-10-CM | POA: Diagnosis present

## 2016-06-30 DIAGNOSIS — R590 Localized enlarged lymph nodes: Secondary | ICD-10-CM | POA: Diagnosis not present

## 2016-06-30 LAB — GLUCOSE, CAPILLARY: Glucose-Capillary: 99 mg/dL (ref 65–99)

## 2016-06-30 MED ORDER — FLUDEOXYGLUCOSE F - 18 (FDG) INJECTION
6.7800 | Freq: Once | INTRAVENOUS | Status: AC | PRN
  Administered 2016-06-30: 6.78 via INTRAVENOUS

## 2016-07-04 ENCOUNTER — Ambulatory Visit (HOSPITAL_BASED_OUTPATIENT_CLINIC_OR_DEPARTMENT_OTHER): Payer: Medicare Other | Admitting: Hematology

## 2016-07-04 ENCOUNTER — Encounter: Payer: Self-pay | Admitting: Hematology

## 2016-07-04 VITALS — BP 158/68 | HR 87 | Temp 98.2°F | Resp 18 | Ht <= 58 in | Wt 126.0 lb

## 2016-07-04 DIAGNOSIS — R7989 Other specified abnormal findings of blood chemistry: Secondary | ICD-10-CM

## 2016-07-04 DIAGNOSIS — R599 Enlarged lymph nodes, unspecified: Secondary | ICD-10-CM

## 2016-07-04 DIAGNOSIS — D649 Anemia, unspecified: Secondary | ICD-10-CM | POA: Diagnosis not present

## 2016-07-04 DIAGNOSIS — R634 Abnormal weight loss: Secondary | ICD-10-CM | POA: Diagnosis not present

## 2016-07-04 DIAGNOSIS — R748 Abnormal levels of other serum enzymes: Secondary | ICD-10-CM | POA: Diagnosis not present

## 2016-07-04 DIAGNOSIS — D892 Hypergammaglobulinemia, unspecified: Secondary | ICD-10-CM | POA: Diagnosis not present

## 2016-07-04 DIAGNOSIS — K869 Disease of pancreas, unspecified: Secondary | ICD-10-CM

## 2016-07-04 DIAGNOSIS — R590 Localized enlarged lymph nodes: Secondary | ICD-10-CM

## 2016-07-04 DIAGNOSIS — R945 Abnormal results of liver function studies: Secondary | ICD-10-CM

## 2016-07-06 NOTE — Progress Notes (Signed)
Marland Kitchen    HEMATOLOGY/ONCOLOGY CONSULTATION NOTE  Date of Service:07/04/2016  Patient Care Team: Thressa Sheller, MD as PCP - General (Internal Medicine)  CHIEF COMPLAINTS/PURPOSE OF CONSULTATION:  Newly diagnosed anemia "Evaluation for leukemia"  HISTORY OF PRESENTING ILLNESS:  [name] is a wonderful 82 y.o. female who has been referred to [place] by Dr .Thressa Sheller, MD  for evaluation and management of "Possible leukemia"  Patient with history of hypertension, diabetes, chronic kidney disease stage III, vitamin D deficiency, hypothyroidism Who presented to the emergency room on 04/23/2016 with 1 week history of dysuria and increasing weakness and fatigue. She had also been walking and [place] trip and struck the back of her head on the cement leading to a laceration that was bleeding. She is what brought her to the emergency room where she was found to have low-grade fevers and tachycardia to 120. CT head was negative. Patient had staples to her head laceration. Workup showed urinary tract infection and a new anemia with a hemoglobin of 7 and elevated lactic acid of 5.37. In the hospital the patient was noted to have signs of sepsis due to her urinary tract infection and was treated with IV ceftriaxone followed by oral ciprofloxacin as discharge. She was noted to have hyponatremia due to dehydration which improved.Her normocytic anemia showed hemoglobin as low as 6 and she was worked up stool occult testing was negative. SPEP was negative for an M spike. LDH and haptoglobin within normal limits. Received 2 units of PRBCs after which a hemoglobin was noted to be stable. Previous colonoscopy was at age 76 after which she that she didn't need any further screening. Pap smear was done long time ago. Last mammogram was in 2005.  She was also noted to have an isolated elevated alkaline phosphatase. She had an ultrasound of the abdomen as outpatient on 04/06/2016 which showed post cholecystectomy  status with no acute intra-abdominal pathology. No biliary dilatation noted. Also noted to have Trichomonas -and received metronidazole.   Patient was subsequently discharged home.  Patient has subsequently had a CT of the abdomen and pelvis with Dr. Noah Delaine which showed mild intrahepatic biliary dilatation without any obstructing mass. 2 cystic structures in the pancreatic head. Nonspecific left periaortic enlarged lymph node measuring 12 mm. Enlarged right pericardial lymph node along the right anterior aspect measuring 12.5 mm - ?reactive vs neoplastic.  Patient has reported drop in her weight from 160 to 115 pounds over the last several months. She also reports increasing lower extremity edema associated with hypoalbuminemia. She has been working with physical therapy.  Patient has had increasing alkaline phosphatase levels, bilirubin elevated to 2.6 and AST of 101 on 05/18/2016. GGT was also elevated to 769.  INTERVAL HISTORY  [name] is here for follow-up along with her daughter who is visiting from [place]. we discussed the PET/CT scan and lab results in details. Patient notes no other acute new symptoms since her last visit. Still has bilateral lower extremity swelling without pain or redness.  MEDICAL HISTORY:  Past Medical History:  Diagnosis Date  . Asthma   . Diabetes mellitus without complication ([place])   . Hypertension   . Thyroid disease   Chronic kidney disease stage III Vitamin D deficiency Hypothyroidism on replacement for about 10 years   SURGICAL HISTORY: Past Surgical History:  Procedure Laterality Date  . APPENDECTOMY    . CHOLECYSTECTOMY    . THYROID SURGERY      SOCIAL HISTORY: Social History   Social  History  . Marital status: Widowed    Spouse name: N/A  . Number of children: N/A  . Years of education: N/A   Occupational History  . Not on file.   Social History Main Topics  . Smoking status: Never Smoker  . Smokeless tobacco: Not on  file  . Alcohol use No  . Drug use: No  . Sexual activity: No   Other Topics Concern  . Not on file   Social History Narrative  . No narrative on file    FAMILY HISTORY: Family History  Problem Relation Age of Onset  . Diabetes Maternal Grandmother     Diabetic coma  . Hypertension Paternal Grandmother   . Cancer Neg Hx     ALLERGIES:  has No Known Allergies.  MEDICATIONS:  Current Outpatient Prescriptions  Medication Sig Dispense Refill  . Amlodipine-Valsartan-HCTZ (EXFORGE HCT) 5-160-12.5 MG TABS Take 1 tablet by mouth daily.    Marland Kitchen aspirin EC 81 MG tablet Take 81 mg by mouth every evening.     . ciprofloxacin (CIPRO) 250 MG tablet Take 1 tablet (250 mg total) by mouth 2 (two) times daily. X 4 more days 8 tablet 0  . ferrous sulfate 325 (65 FE) MG tablet Take 325 mg by mouth daily.  3  . levothyroxine (SYNTHROID, LEVOTHROID) 112 MCG tablet Take 112 mcg by mouth daily before breakfast.    . metFORMIN (GLUCOPHAGE) 500 MG tablet Take 500 mg by mouth 2 (two) times daily with a meal.    . metoprolol succinate (TOPROL-XL) 50 MG 24 hr tablet Take 50 mg by mouth daily. Take with or immediately following a meal.     No current facility-administered medications for this visit.     REVIEW OF SYSTEMS:    10 Point review of Systems was done is negative except as noted above.  PHYSICAL EXAMINATION: ECOG PERFORMANCE STATUS: 3 - Symptomatic, >50% confined to bed  . Vitals:   07/04/16 1519  BP: (!) 158/68  Pulse: 87  Resp: 18  Temp: 98.2 F (36.8 C)   Filed Weights   07/04/16 1519  Weight: 126 lb (57.2 kg)   .Body mass index is 27.27 kg/m.  GENERAL:alert,Elderly African-American female  in no acute distress and comfortable SKIN: skin color, texture, turgor are normal, no rashes or significant lesions EYES: normal, conjunctiva are pink and non-injected, sclera clear OROPHARYNX:no exudate, no erythema and lips, buccal mucosa, and tongue normal  NECK: supple, no  JVD LYMPH:  no palpable lymphadenopathy in the cervical, axillary or inguinal LUNGS: clear to auscultation with normal respiratory effort HEART: regular rate & rhythm,  no murmurs and bilateral lower extremity 2+ pitting pedal edema  ABDOMEN: abdomen soft, non-tender, normoactive bowel sounds  PSYCH: alert & oriented x 3 with fluent speech NEURO: no focal motor/sensory deficits  LABORATORY DATA:  I have reviewed the data as listed  . CBC Latest Ref Rng & Units 06/23/2016 04/27/2016 04/26/2016  WBC 3.9 - 10.3 10e3/uL 7.8 8.1 8.2  Hemoglobin 11.6 - 15.9 g/dL 8.7(L) 10.0(L) 10.2(L)  Hematocrit 34.8 - 46.6 % 26.9(L) 30.3(L) 31.5(L)  Platelets 145 - 400 10e3/uL 371 426(H) 445(H)    . CMP Latest Ref Rng & Units 06/23/2016 06/23/2016 04/27/2016  Glucose 70 - 140 mg/dl 150(H) - 121(H)  BUN 7.0 - 26.0 mg/dL 18.4 - 8  Creatinine 0.6 - 1.1 mg/dL 1.0 - 0.89  Sodium 136 - 145 mEq/L 132(L) - 133(L)  Potassium 3.5 - 5.1 mEq/L 3.7 - 3.8  Chloride  101 - 111 mmol/L - - 103  CO2 22 - 29 mEq/L 26 - 23  Calcium 8.4 - 10.4 mg/dL 8.5 - 7.9(L)  Total Protein 6.0 - 8.5 g/dL 10.0(H) 9.0(H) 8.5(H)  Total Bilirubin 0.20 - 1.20 mg/dL 1.59(H) - 1.3(H)  Alkaline Phos 40 - 150 U/L 764(H) - 650(H)  AST 5 - 34 U/L 76(H) - 79(H)  ALT 0 - 55 U/L 20 - 17   Albumin 1.8      Component     Latest Ref Rng & Units 06/23/2016  Ig Kappa Free Light Chain     3.3 - 19.4 mg/L 301.0 (H)  Ig Lambda Free Light Chain     5.7 - 26.3 mg/L 176.3 (H)  Kappa/Lambda FluidC Ratio     0.26 - 1.65 1.71 (H)  CA 19-9     0 - 35 U/mL 309 (H)  CEA     0.0 - 4.7 ng/mL 4.6  Vitamin B12     211 - 946 pg/mL >2000 (H)  LDH     125 - 245 U/L 175  Sed Rate     0 - 40 mm/hr >140 (H)  Beta 2     0.6 - 2.4 mg/L 4.3 (H)  Ferritin     9 - 269 ng/ml 159  Erythropoietin     2.6 - 18.5 mIU/mL 15.3   RADIOGRAPHIC STUDIES: I have personally reviewed the radiological images as listed and agreed with the findings in the report. Nm Pet  Image Initial (pi) Skull Base To Thigh  Result Date: 07/01/2016 CLINICAL DATA:  Initial treatment strategy for lymphadenopathy. Weight loss, anemia and proteinemia concerning for lymphoma or plasma cell dyscrasia. EXAM: NUCLEAR MEDICINE PET SKULL BASE TO THIGH TECHNIQUE: 6.78 mCi F-18 FDG was injected intravenously. Full-ring PET imaging was performed from the skull base to thigh after the radiotracer. CT data was obtained and used for attenuation correction and anatomic localization. FASTING BLOOD GLUCOSE:  Value: 99 mg/dl COMPARISON:  CT scan 06/03/2016 FINDINGS: NECK No hypermetabolic lymph nodes in the neck. Mild symmetric sella very gland activity is noted bilaterally. Dense carotid artery calcifications are also noted. The thyroid gland is surgically absent. CHEST 10.5 mm superior mediastinal lymph node just posterior to the right clavicular head on image number 35 is hypermetabolic with SUV max of 5.2. No other enlarged or hypermetabolic mediastinal or hilar lymph nodes. Enlarged epicardial lymph nodes on the right, demonstrated on the recent CT scan, are hypermetabolic with SUV max of 4.6 Advanced atherosclerotic calcifications involving the aorta and coronary arteries. No worrisome pulmonary lesions or acute pulmonary findings. ABDOMEN/PELVIS 16.5 mm gastrohepatic ligament lymph node on image number 86 is mildly hypermetabolic with SUV max of 4.2. Hepatoduodenum lymph node measuring 7 mm on image number 90 has an SUV max of 3.7. 9 mm retroperitoneal lymph node on image 99 is mildly hypermetabolic with SUV max of 3.2. No enlarged or hypermetabolic pelvic or inguinal lymph nodes. No abnormal areas of hypermetabolism in the solid abdominal organs. No splenomegaly. Mild symmetric uptake in the adrenal glands without definite nodule. Small cysts again noted in the head of the pancreas. SKELETON No focal hypermetabolic activity to suggest skeletal metastasis. IMPRESSION: 1. Mildly hypermetabolic lymph nodes in  the chest and abdomen as discussed above. 2. No worrisome pulmonary lesions. 3. Stable intra and extrahepatic biliary dilatation. Electronically Signed   By: Marijo Sanes M.D.   On: 07/01/2016 09:20    ASSESSMENT & PLAN:   82 year old [place] American female  with  #1 normocytic normochromic anemia LDH and haptoglobin levels done previously within normal limits Other than some acute blood loss due to her scalp laceration in May 2017 she has no other overt evidence of bleeding and had a fecal occult blood testing in the hospital which was negative. Ferritin levels and B12 levels within normal limits In the setting of hypergammaglobinemia would need to rule out the presence of an inflammatory disorder, malignancy or multiple myeloma. The patient's SPEP and UPEP did not show any signs of an M protein , serum immunoelectrophoresis shows a polyclonal gammopathy.PET/CT scan did not show any skeletal lesions concerning for multiple myeloma.  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 Kappa and lambda typing appear increased. repeat SPEP shows no M protein . UPEP with no M protein . Immunofixation electrophoresis shows a polyclonal gammopathy . PET/CT scan did not show any concerning skeletal lesions.  elevation in both kappa And lambda free light chains. No definitive evidence of multiple myeloma at this time and hypogammaglobinemia appears to be more likely due to malignancy or a different inflammatory state. Cannot rule out a nonsecretory multiple myeloma. May likely need a bone marrow examination if no other etiology is found.  #3 abnormal liver function tests with pancreatic lesions and small abdominal adenopathy and obstructive-appearing LFTs with elevated GGT and alkaline phosphatase. Significant weight loss and anemia. Low albumin levels suggesting hyper catabolic state and /or poor oral intake PET/CT scan shows mildly FDG avid lymphadenopathy in the chest and  abdomen.  LDH level within normal limits suggesting against an aggressive lymphoma. CEA within normal limits CA-19-9 elevated to 309 concerning for possible pancreatic or biliary malignancy causing the patient's obstructive LFTs and lymphadenopathy. PLAN  -I recommended to the patient that she does need to have a gastroenterology evaluation with EGD/EUS and MRCP/ERCP to rule out a pancreaticobiliary malignancy. -We discussed that if this workup is negative we would proceed with a bone marrow examination . -Patient reports that she is tired of seeing too many doctors and having testing and would like to discuss how to proceed further with her primary care physician Dr. Noah Delaine .  -I strongly counseled her to have a good goals of care discussion with Dr. Noah Delaine to determine how she would like to proceed .we discussed the concerns that were generated with her about testing . -Daughter is present for this appointment and understood the concerns as well and reports that she will be there for the patient's appointment with Dr. Noah Delaine on 07/06/2016 . -Patient will let us know after her discussion how she would like to proceed .   No orders of the defined types were placed in this encounter.    All of the patients questions were answered with apparent satisfaction. The patient knows to call the clinic with any problems, questions or concerns.  I spent 25 minutes counseling the patient face to face. The total time spent in the appointment was 30 minutes and more than 50% was on counseling and direct patient cares.    Sullivan Lone MD Milo AAHIVMS [facility] Hematology/Oncology Physician [facility]  (Office):       [phone] (Work cell):  [phone] (Fax):           [phone]

## 2016-08-21 IMAGING — CT CT ABD-PELV W/ CM
2 of 5 series · 16 of 46 positions shown, 18 images · IV contrast (APPLIED)
Comparison: None.

CLINICAL DATA: Transaminitis. Loss of appetite. Weight loss of 35
pounds.

EXAM:
CT ABDOMEN AND PELVIS WITH CONTRAST
TECHNIQUE: Multidetector CT imaging of the abdomen and pelvis was performed
using the standard protocol following bolus administration of
intravenous contrast.
CONTRAST:  100mL [id number] IOPAMIDOL ([id number]) INJECTION 61%

[Series 2: abd/pelvis w/cm · axial · 0.67mm/px · z∈[+698,+1058]mm · 13 of 82 slices shown, 15 images]
[im 5/82  soft-tissue]
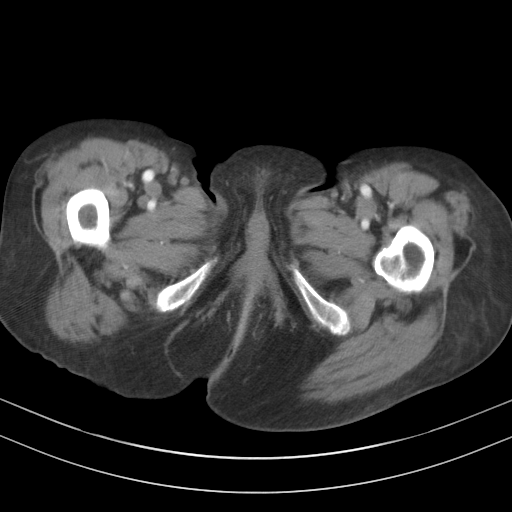
[im 5/82  bone]
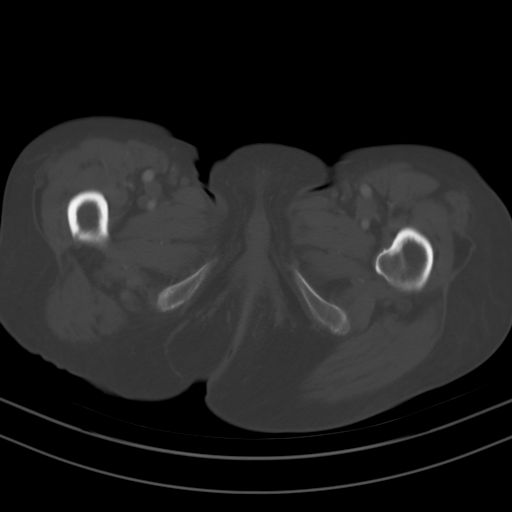
[im 13/82  soft-tissue]
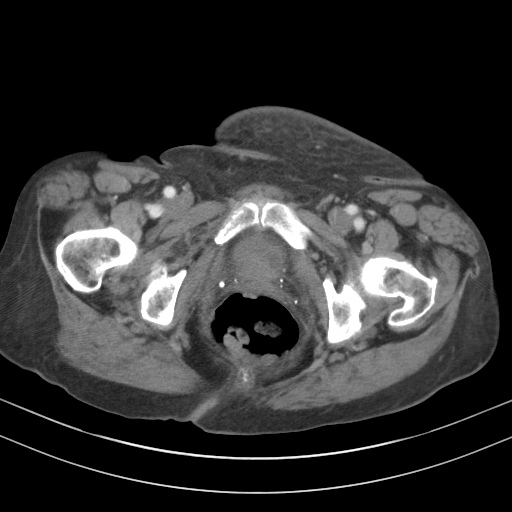
[im 17/82  soft-tissue]
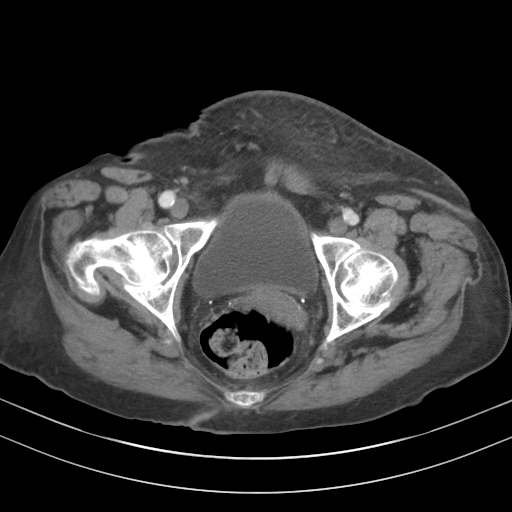
[im 25/82  soft-tissue]
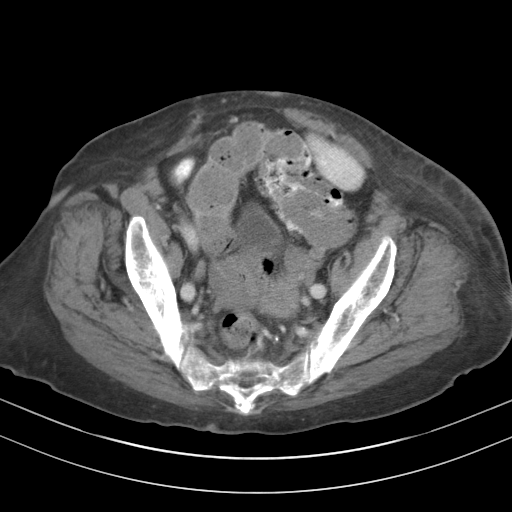
[im 29/82  soft-tissue]
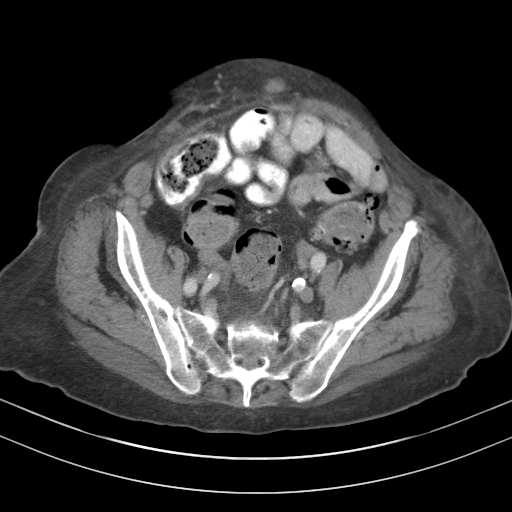
[im 37/82  soft-tissue]
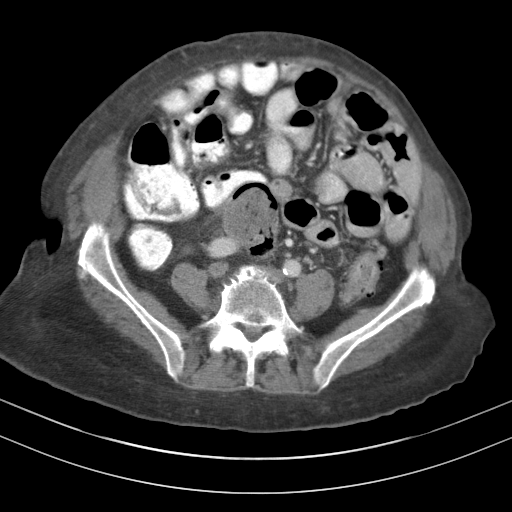
[im 41/82  soft-tissue]
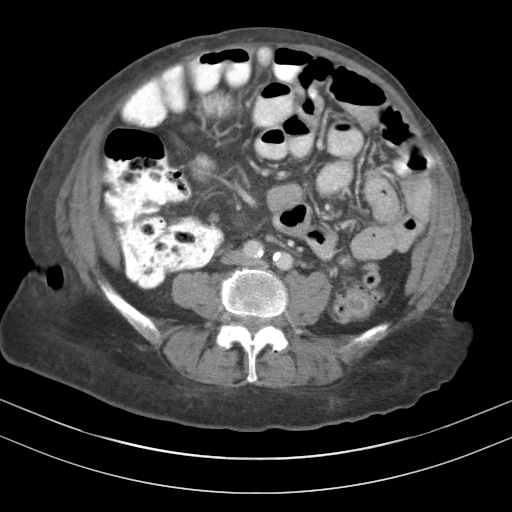
[im 45/82  soft-tissue]
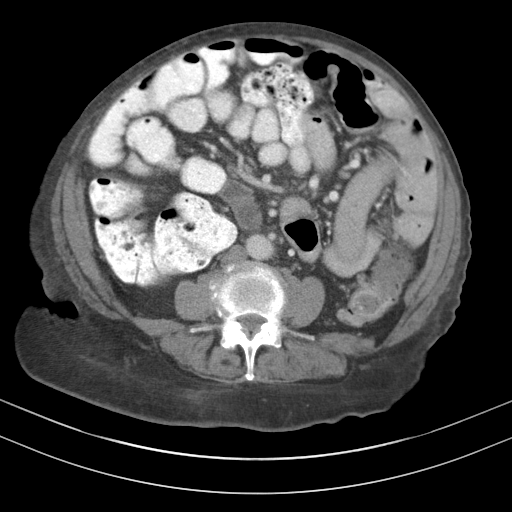
[im 53/82  soft-tissue]
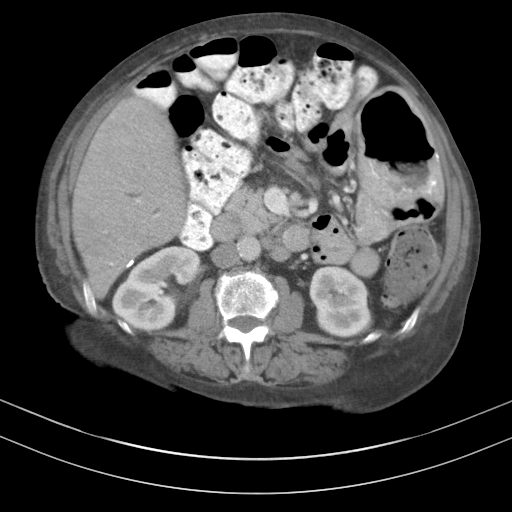
[im 53/82  bone]
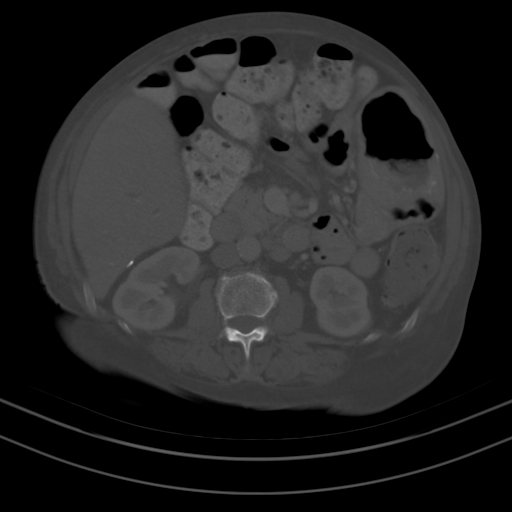
[im 57/82  soft-tissue]
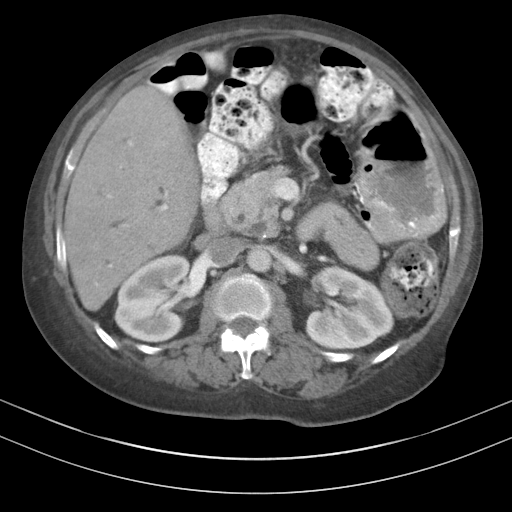
[im 65/82  soft-tissue]
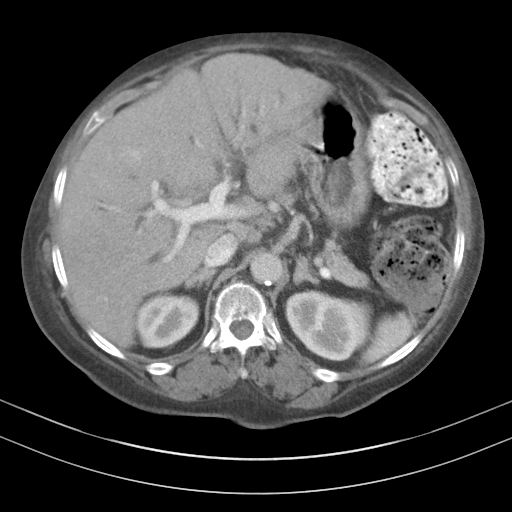
[im 69/82  soft-tissue]
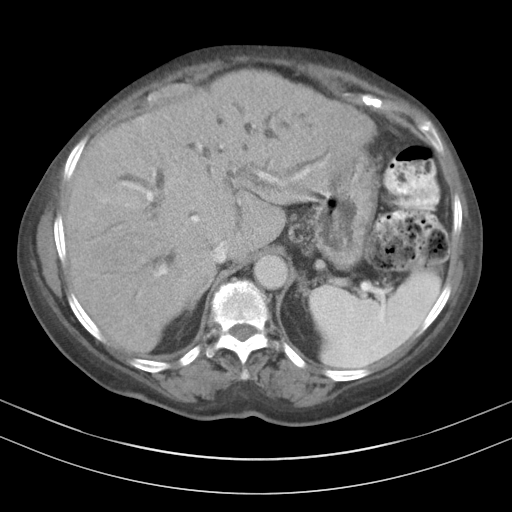
[im 77/82  soft-tissue]
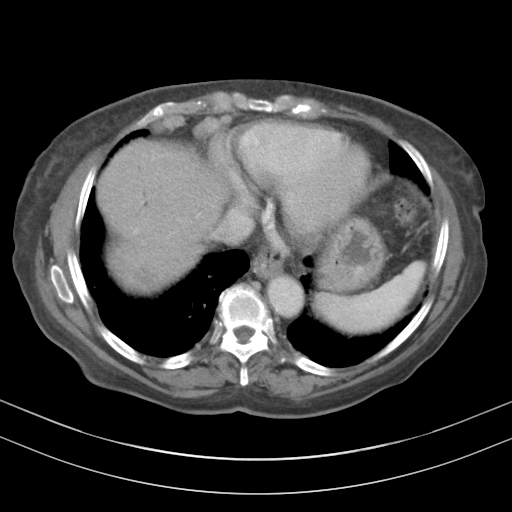

[Series 3: cor · coronal · 0.63mm/px · 3 of 92 slices shown]
[im 31/92  soft-tissue]
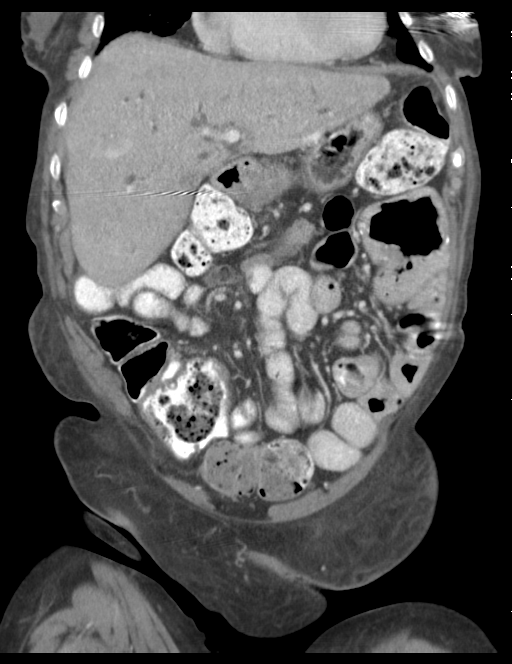
[im 41/92  soft-tissue]
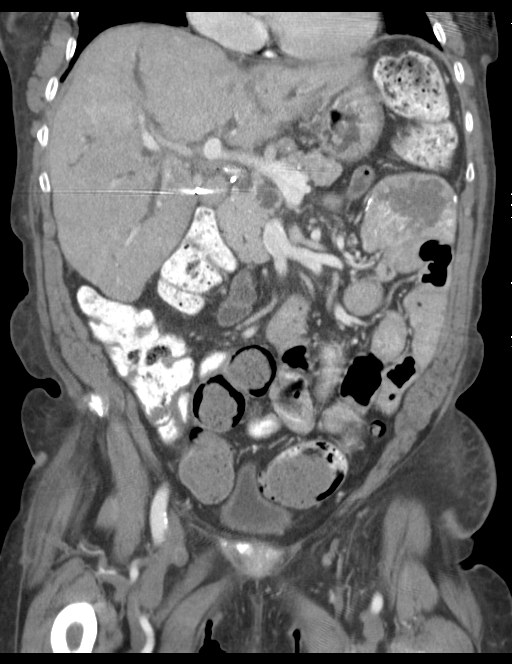
[im 51/92  soft-tissue]
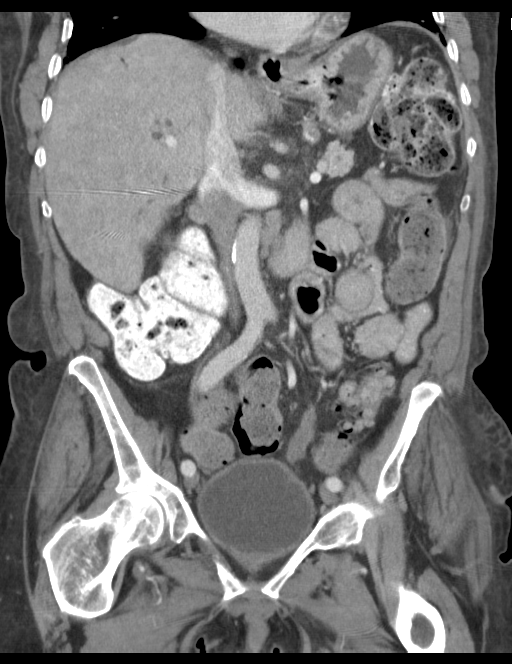

[16 of 46 positions shown; findings below may reference images not displayed]

FINDINGS: Lower chest: Lung bases are clear. Enlarged heart size. Enlarged
right pericardial lymph node along the right anterior aspect
measuring 12.5 mm in short axis.

Hepatobiliary: No hepatic mass. Mild intrahepatic biliary ductal
dilatation. Prior cholecystectomy.

Pancreas: 9 x 15 mm hypodense, fluid attenuating pancreatic head
mass likely representing a cyst. Smaller adjacent 13 x 8 mm cystic
structure in the pancreatic head likely representing a cyst. No
solid pancreatic mass.

Spleen: Normal spleen

Adrenals/Urinary Tract: Normal adrenal glands. Normal kidneys. No
obstructive uropathy. Normal bladder.

Stomach/Bowel: No bowel dilatation or bowel wall thickening. No
pneumatosis, pneumoperitoneum or portal venous gas.

Vascular/Lymphatic: Normal normal caliber abdominal aorta with
atherosclerosis. Left periaortic enlarged lymph node measuring 12 mm
in short axis.

Reproductive: Normal uterus.  No adnexal mass.

Other: No fluid collection or hematoma.

Musculoskeletal: No acute osseous abnormality. No lytic or sclerotic
osseous lesion. Lower lumbar spine spondylosis.
IMPRESSION: 1. Mild intrahepatic biliary ductal dilatation without an
obstructing mass. This is likely sequela of secondary to prior
cholecystectomy.
2. Two cystic structures in the pancreatic head likely reflecting
pancreatic cysts.
3. Nonspecific left periaortic enlarged lymph node measuring 12 mm
in short axis. Enlarged right pericardial lymph node along the right
anterior aspect measuring 12.5 mm in short axis. These are of
uncertain etiology and may be reactive. Alternatively these can be
seen in the setting of lymphoproliferative disease is. Correlation
with laboratory values is recommended.
4.  Aortic Atherosclerosis (ZVRXT-170.0)

## 2016-10-26 ENCOUNTER — Encounter (HOSPITAL_COMMUNITY): Payer: Self-pay | Admitting: *Deleted

## 2016-10-26 ENCOUNTER — Emergency Department (HOSPITAL_COMMUNITY)
Admission: EM | Admit: 2016-10-26 | Discharge: 2016-10-26 | Disposition: A | Discharge status: Home / Self Care | Payer: Medicare Other | Attending: Emergency Medicine | Admitting: Emergency Medicine

## 2016-10-26 ENCOUNTER — Emergency Department (HOSPITAL_COMMUNITY): Payer: Medicare Other

## 2016-10-26 DIAGNOSIS — Z7982 Long term (current) use of aspirin: Secondary | ICD-10-CM | POA: Insufficient documentation

## 2016-10-26 DIAGNOSIS — Y9289 Other specified places as the place of occurrence of the external cause: Secondary | ICD-10-CM | POA: Diagnosis not present

## 2016-10-26 DIAGNOSIS — I10 Essential (primary) hypertension: Secondary | ICD-10-CM | POA: Insufficient documentation

## 2016-10-26 DIAGNOSIS — W1811XA Fall from or off toilet without subsequent striking against object, initial encounter: Secondary | ICD-10-CM | POA: Insufficient documentation

## 2016-10-26 DIAGNOSIS — E119 Type 2 diabetes mellitus without complications: Secondary | ICD-10-CM | POA: Insufficient documentation

## 2016-10-26 DIAGNOSIS — Z7984 Long term (current) use of oral hypoglycemic drugs: Secondary | ICD-10-CM | POA: Insufficient documentation

## 2016-10-26 DIAGNOSIS — Y939 Activity, unspecified: Secondary | ICD-10-CM | POA: Diagnosis not present

## 2016-10-26 DIAGNOSIS — M7918 Myalgia, other site: Secondary | ICD-10-CM

## 2016-10-26 DIAGNOSIS — W19XXXA Unspecified fall, initial encounter: Secondary | ICD-10-CM

## 2016-10-26 DIAGNOSIS — Y999 Unspecified external cause status: Secondary | ICD-10-CM | POA: Diagnosis not present

## 2016-10-26 DIAGNOSIS — M545 Low back pain: Secondary | ICD-10-CM | POA: Diagnosis present

## 2016-10-26 DIAGNOSIS — J45909 Unspecified asthma, uncomplicated: Secondary | ICD-10-CM | POA: Diagnosis not present

## 2016-10-26 DIAGNOSIS — M533 Sacrococcygeal disorders, not elsewhere classified: Secondary | ICD-10-CM | POA: Insufficient documentation

## 2016-10-26 MED ORDER — ACETAMINOPHEN 500 MG PO TABS
1000.0000 mg | ORAL_TABLET | Freq: Once | ORAL | Status: AC
  Administered 2016-10-26: 1000 mg via ORAL
  Filled 2016-10-26: qty 2

## 2016-10-26 MED ORDER — OXYCODONE HCL 5 MG PO TABS
2.5000 mg | ORAL_TABLET | Freq: Once | ORAL | Status: AC
  Administered 2016-10-26: 2.5 mg via ORAL
  Filled 2016-10-26: qty 1

## 2016-10-26 NOTE — ED Provider Notes (Signed)
[place] DEPT Provider Note   CSN: [id number] Arrival date & time: 10/26/16  1812   By signing my name below, I, Evelene Croon, attest that this documentation has been prepared under the direction and in the presence of Deno Etienne, DO . Electronically Signed: Evelene Croon, Scribe. 10/26/2016. 6:39 PM.  History   Chief Complaint Chief Complaint  Patient presents with  . Fall     The history is provided by the patient. No language interpreter was used.  Fall  This is a new problem. The current episode started yesterday. The problem has been gradually worsening. Pertinent negatives include no chest pain, no abdominal pain, no headaches and no shortness of breath. Exacerbated by: movement. Nothing relieves the symptoms. She has tried nothing for the symptoms. The treatment provided no relief.     HPI Comments:  [name] is a 82 y.o. female who presents to the Emergency Department complaining of gradual onset bilateral hip and lower back pain since this AM. Pt states she usually uses the sink to help herself off of the toilet but she did not have a good grip and fell back onto the toilet; no head injury or LOC. She denies abdominal pain and CP.   Past Medical History:  Diagnosis Date  . Asthma   . Diabetes mellitus without complication ([place])   . Hypertension   . Thyroid disease     Patient Active Problem List   Diagnosis Date Noted  . Sepsis ([place]) 04/23/2016  . UTI (lower urinary tract infection) 04/23/2016  . Hyponatremia 04/23/2016  . Hypokalemia 04/23/2016  . Hypocalcemia 04/23/2016  . Microcytic anemia 04/23/2016  . Trichomonas infection 04/23/2016    Past Surgical History:  Procedure Laterality Date  . APPENDECTOMY    . CHOLECYSTECTOMY    . THYROID SURGERY      OB History    No data available       Home Medications    Prior to Admission medications   Medication Sig Start Date End Date Taking? Authorizing Provider  Amlodipine-Valsartan-HCTZ  (EXFORGE HCT) 5-160-12.5 MG TABS Take 1 tablet by mouth daily.    Historical Provider, MD  aspirin EC 81 MG tablet Take 81 mg by mouth every evening.     Historical Provider, MD  ciprofloxacin (CIPRO) 250 MG tablet Take 1 tablet (250 mg total) by mouth 2 (two) times daily. X 4 more days 04/27/16   Ripudeep K Rai, MD  ferrous sulfate 325 (65 FE) MG tablet Take 325 mg by mouth daily. 04/12/16   Historical Provider, MD  levothyroxine (SYNTHROID, LEVOTHROID) 112 MCG tablet Take 112 mcg by mouth daily before breakfast.    Historical Provider, MD  metFORMIN (GLUCOPHAGE) 500 MG tablet Take 500 mg by mouth 2 (two) times daily with a meal.    Historical Provider, MD  metoprolol succinate (TOPROL-XL) 50 MG 24 hr tablet Take 50 mg by mouth daily. Take with or immediately following a meal.    Historical Provider, MD    Family History Family History  Problem Relation Age of Onset  . Diabetes Maternal Grandmother     Diabetic coma  . Hypertension Paternal Grandmother   . Cancer Neg Hx     Social History Social History  Substance Use Topics  . Smoking status: Never Smoker  . Smokeless tobacco: Not on file  . Alcohol use No     Allergies   Patient has no known allergies.   Review of Systems Review of Systems  Constitutional: Negative for  chills and fever.  HENT: Negative for congestion and rhinorrhea.   Eyes: Negative for redness and visual disturbance.  Respiratory: Negative for shortness of breath and wheezing.   Cardiovascular: Negative for chest pain and palpitations.  Gastrointestinal: Negative for abdominal pain, nausea and vomiting.  Genitourinary: Negative for dysuria and urgency.  Musculoskeletal: Positive for arthralgias and back pain.  Skin: Negative for pallor and wound.  Neurological: Negative for dizziness and headaches.     Physical Exam Updated Vital Signs BP 138/73 (BP Location: Left Arm)   Pulse 76   Temp 98.4 F (36.9 C) (Oral)   Resp 18   SpO2 98%   Physical  Exam  Constitutional: She is oriented to person, place, and time. She appears well-developed and well-nourished. No distress.  HENT:  Head: Normocephalic and atraumatic.  Eyes: EOM are normal. Pupils are equal, round, and reactive to light.  Neck: Normal range of motion. Neck supple.  Cardiovascular: Normal rate and regular rhythm.  Exam reveals no gallop and no friction rub.   No murmur heard. Pulmonary/Chest: Effort normal. She has no wheezes. She has no rales.  Abdominal: Soft. She exhibits no distension. There is no tenderness.  Musculoskeletal: She exhibits tenderness. She exhibits no edema.  Minimal pain with internal/external rotation of either hip Mild pain with compression of pelvis Tenderness to bilateral buttocks  No midline spinal tenderness   Neurological: She is alert and oriented to person, place, and time.  Skin: Skin is warm and dry. She is not diaphoretic.  Psychiatric: She has a normal mood and affect. Her behavior is normal.  Nursing note and vitals reviewed.    ED Treatments / Results  DIAGNOSTIC STUDIES:  Oxygen Saturation is 98% on RA, normal by my interpretation.    COORDINATION OF CARE:  6:34 PM Discussed treatment plan with pt at bedside and pt agreed to plan.  Labs (all labs ordered are listed, but only abnormal results are displayed) Labs Reviewed - No data to display  EKG  EKG Interpretation None       Radiology Dg Lumbar Spine Complete  Result Date: 10/26/2016 CLINICAL DATA:  82 y/o  F; bilateral hip and back pain after fall. EXAM: LUMBAR SPINE - COMPLETE 4+ VIEW COMPARISON:  06/03/2016 CT of the abdomen and pelvis. FINDINGS: Mild lumbar dextrocurvature. No loss of vertebral body height. Mild loss of disc space height at the L3-4 and L4-5 levels. Lower lumbar facet arthrosis. Right upper quadrant surgical clips. Grade 1 L4-5 anterolisthesis. IMPRESSION: No acute fracture or dislocation of the lumbar spine identified. Lumbar degenerative  changes are grossly stable from prior CT given differences in technique. Electronically Signed   By: Kristine Garbe M.D.   On: 10/26/2016 19:40   Dg Hips Bilat W Or Wo Pelvis 3-4 Views  Result Date: 10/26/2016 CLINICAL DATA:  Fall yesterday with bilateral hip pain, initial encounter EXAM: DG HIP (WITH OR WITHOUT PELVIS) 3-4V BILAT COMPARISON:  None. FINDINGS: The pelvic ring is intact. No acute fracture or dislocation is noted. Mild degenerative changes of the hip joints are seen bilaterally. No gross soft tissue abnormality is noted. IMPRESSION: Mild degenerative change without acute abnormality. Electronically Signed   By: Inez Catalina M.D.   On: 10/26/2016 19:38    Procedures Procedures (including critical care time)  Medications Ordered in ED Medications  acetaminophen (TYLENOL) tablet 1,000 mg (1,000 mg Oral Given 10/26/16 1846)  oxyCODONE (Oxy IR/ROXICODONE) immediate release tablet 2.5 mg (2.5 mg Oral Given 10/26/16 1845)  Initial Impression / Assessment and Plan / ED Course  I have reviewed the triage vital signs and the nursing notes.  Pertinent labs & imaging results that were available during my care of the patient were reviewed by me and considered in my medical decision making (see chart for details).  Clinical Course     82 yo F With a fall from standing onto the toilet. Patient lost her grip on the sink and fell onto her bottom. No pain initially but worsening pain this morning. Plain films are negative for fracture. Patient is able to ambulate. Discharge home.  9:06 PM:  I have discussed the diagnosis/risks/treatment options with the patient and family and believe the pt to be eligible for discharge home to follow-up with PCP. We also discussed returning to the ED immediately if new or worsening sx occur. We discussed the sx which are most concerning (e.g., sudden worsening pain, fever, inability to tolerate by mouth) that necessitate immediate return.  Medications administered to the patient during their visit and any new prescriptions provided to the patient are listed below.  Medications given during this visit Medications  acetaminophen (TYLENOL) tablet 1,000 mg (1,000 mg Oral Given 10/26/16 1846)  oxyCODONE (Oxy IR/ROXICODONE) immediate release tablet 2.5 mg (2.5 mg Oral Given 10/26/16 1845)     The patient appears reasonably screen and/or stabilized for discharge and I doubt any other medical condition or other [facility] requiring further screening, evaluation, or treatment in the ED at this time prior to discharge.    Final Clinical Impressions(s) / ED Diagnoses   Final diagnoses:  Fall, initial encounter  Acute buttock pain    New Prescriptions Discharge Medication List as of 10/26/2016  8:18 PM      I personally performed the services described in this documentation, which was scribed in my presence. The recorded information has been reviewed and is accurate.     Deno Etienne, DO 10/26/16 2106

## 2016-10-26 NOTE — Discharge Instructions (Signed)
°  Also take tylenol 1000mg(2 extra strength) four times a day.   

## 2016-10-26 NOTE — ED Triage Notes (Signed)
Pt reports falling yesterday in bathroom. Having pain to bilateral hips, lower back and buttock. Difficulty ambulating and sleeping since the fall. Denies hitting her head, denies being on blood thinners.

## 2016-11-24 ENCOUNTER — Encounter (HOSPITAL_COMMUNITY): Payer: Self-pay | Admitting: Emergency Medicine

## 2016-11-24 ENCOUNTER — Inpatient Hospital Stay (HOSPITAL_COMMUNITY)
Admission: EM | Admit: 2016-11-24 | Discharge: 2016-11-30 | DRG: 368 | Disposition: A | Discharge status: Skilled Nursing Facility | Payer: Medicare Other | Attending: Family Medicine | Admitting: Family Medicine

## 2016-11-24 DIAGNOSIS — E860 Dehydration: Secondary | ICD-10-CM | POA: Diagnosis present

## 2016-11-24 DIAGNOSIS — I959 Hypotension, unspecified: Secondary | ICD-10-CM

## 2016-11-24 DIAGNOSIS — R579 Shock, unspecified: Secondary | ICD-10-CM

## 2016-11-24 DIAGNOSIS — Z79899 Other long term (current) drug therapy: Secondary | ICD-10-CM

## 2016-11-24 DIAGNOSIS — D649 Anemia, unspecified: Secondary | ICD-10-CM | POA: Diagnosis present

## 2016-11-24 DIAGNOSIS — J45909 Unspecified asthma, uncomplicated: Secondary | ICD-10-CM | POA: Diagnosis present

## 2016-11-24 DIAGNOSIS — K766 Portal hypertension: Secondary | ICD-10-CM | POA: Diagnosis present

## 2016-11-24 DIAGNOSIS — I8500 Esophageal varices without bleeding: Secondary | ICD-10-CM

## 2016-11-24 DIAGNOSIS — D890 Polyclonal hypergammaglobulinemia: Secondary | ICD-10-CM | POA: Diagnosis present

## 2016-11-24 DIAGNOSIS — N179 Acute kidney failure, unspecified: Secondary | ICD-10-CM | POA: Diagnosis not present

## 2016-11-24 DIAGNOSIS — E1165 Type 2 diabetes mellitus with hyperglycemia: Secondary | ICD-10-CM | POA: Diagnosis present

## 2016-11-24 DIAGNOSIS — Z6828 Body mass index (BMI) 28.0-28.9, adult: Secondary | ICD-10-CM

## 2016-11-24 DIAGNOSIS — Z66 Do not resuscitate: Secondary | ICD-10-CM | POA: Diagnosis present

## 2016-11-24 DIAGNOSIS — A419 Sepsis, unspecified organism: Secondary | ICD-10-CM

## 2016-11-24 DIAGNOSIS — R52 Pain, unspecified: Secondary | ICD-10-CM

## 2016-11-24 DIAGNOSIS — K7469 Other cirrhosis of liver: Secondary | ICD-10-CM | POA: Diagnosis present

## 2016-11-24 DIAGNOSIS — E8809 Other disorders of plasma-protein metabolism, not elsewhere classified: Secondary | ICD-10-CM | POA: Diagnosis present

## 2016-11-24 DIAGNOSIS — E039 Hypothyroidism, unspecified: Secondary | ICD-10-CM | POA: Diagnosis present

## 2016-11-24 DIAGNOSIS — D72825 Bandemia: Secondary | ICD-10-CM

## 2016-11-24 DIAGNOSIS — E861 Hypovolemia: Secondary | ICD-10-CM | POA: Diagnosis present

## 2016-11-24 DIAGNOSIS — K269 Duodenal ulcer, unspecified as acute or chronic, without hemorrhage or perforation: Secondary | ICD-10-CM | POA: Diagnosis present

## 2016-11-24 DIAGNOSIS — E1122 Type 2 diabetes mellitus with diabetic chronic kidney disease: Secondary | ICD-10-CM | POA: Diagnosis present

## 2016-11-24 DIAGNOSIS — R109 Unspecified abdominal pain: Secondary | ICD-10-CM

## 2016-11-24 DIAGNOSIS — Z7982 Long term (current) use of aspirin: Secondary | ICD-10-CM

## 2016-11-24 DIAGNOSIS — K3189 Other diseases of stomach and duodenum: Secondary | ICD-10-CM

## 2016-11-24 DIAGNOSIS — R578 Other shock: Secondary | ICD-10-CM | POA: Diagnosis present

## 2016-11-24 DIAGNOSIS — K8689 Other specified diseases of pancreas: Secondary | ICD-10-CM

## 2016-11-24 DIAGNOSIS — R739 Hyperglycemia, unspecified: Secondary | ICD-10-CM | POA: Diagnosis not present

## 2016-11-24 DIAGNOSIS — D62 Acute posthemorrhagic anemia: Secondary | ICD-10-CM

## 2016-11-24 DIAGNOSIS — K869 Disease of pancreas, unspecified: Secondary | ICD-10-CM | POA: Diagnosis present

## 2016-11-24 DIAGNOSIS — N183 Chronic kidney disease, stage 3 (moderate): Secondary | ICD-10-CM | POA: Diagnosis present

## 2016-11-24 DIAGNOSIS — R634 Abnormal weight loss: Secondary | ICD-10-CM | POA: Diagnosis present

## 2016-11-24 DIAGNOSIS — I129 Hypertensive chronic kidney disease with stage 1 through stage 4 chronic kidney disease, or unspecified chronic kidney disease: Secondary | ICD-10-CM | POA: Diagnosis present

## 2016-11-24 DIAGNOSIS — I8511 Secondary esophageal varices with bleeding: Secondary | ICD-10-CM | POA: Diagnosis not present

## 2016-11-24 DIAGNOSIS — K920 Hematemesis: Secondary | ICD-10-CM | POA: Diagnosis not present

## 2016-11-24 DIAGNOSIS — E871 Hypo-osmolality and hyponatremia: Secondary | ICD-10-CM | POA: Diagnosis present

## 2016-11-24 DIAGNOSIS — D72829 Elevated white blood cell count, unspecified: Secondary | ICD-10-CM | POA: Diagnosis present

## 2016-11-24 DIAGNOSIS — Z7984 Long term (current) use of oral hypoglycemic drugs: Secondary | ICD-10-CM

## 2016-11-24 LAB — CBG MONITORING, ED: Glucose-Capillary: 220 mg/dL — ABNORMAL HIGH (ref 65–99)

## 2016-11-24 LAB — I-STAT CG4 LACTIC ACID, ED: LACTIC ACID, VENOUS: 5.14 mmol/L — AB (ref 0.5–1.9)

## 2016-11-24 LAB — CBC
HEMATOCRIT: 10.7 % — AB (ref 36.0–46.0)
Hemoglobin: 3.5 g/dL — CL (ref 12.0–15.0)
MCH: 26.3 pg (ref 26.0–34.0)
MCHC: 32.7 g/dL (ref 30.0–36.0)
MCV: 80.5 fL (ref 78.0–100.0)
PLATELETS: 299 10*3/uL (ref 150–400)
RBC: 1.33 MIL/uL — ABNORMAL LOW (ref 3.87–5.11)
RDW: 14.8 % (ref 11.5–15.5)
WBC: 13 10*3/uL — AB (ref 4.0–10.5)

## 2016-11-24 LAB — COMPREHENSIVE METABOLIC PANEL
ALT: 38 U/L (ref 14–54)
ANION GAP: 11 (ref 5–15)
AST: 205 U/L — ABNORMAL HIGH (ref 15–41)
Albumin: 1.1 g/dL — ABNORMAL LOW (ref 3.5–5.0)
Alkaline Phosphatase: 697 U/L — ABNORMAL HIGH (ref 38–126)
BUN: 66 mg/dL — ABNORMAL HIGH (ref 6–20)
CALCIUM: 7.6 mg/dL — AB (ref 8.9–10.3)
CHLORIDE: 100 mmol/L — AB (ref 101–111)
CO2: 17 mmol/L — ABNORMAL LOW (ref 22–32)
CREATININE: 1.63 mg/dL — AB (ref 0.44–1.00)
GFR, EST AFRICAN AMERICAN: 32 mL/min — AB (ref >60)
GFR, EST NON AFRICAN AMERICAN: 27 mL/min — AB (ref >60)
Glucose, Bld: 226 mg/dL — ABNORMAL HIGH (ref 65–99)
Potassium: 4.3 mmol/L (ref 3.5–5.1)
SODIUM: 128 mmol/L — AB (ref 135–145)
Total Bilirubin: 2.2 mg/dL — ABNORMAL HIGH (ref 0.3–1.2)
Total Protein: 7.9 g/dL (ref 6.5–8.1)

## 2016-11-24 LAB — PROTIME-INR
INR: 1.69
Prothrombin Time: 20.1 seconds — ABNORMAL HIGH (ref 11.4–15.2)

## 2016-11-24 LAB — PREPARE RBC (CROSSMATCH)

## 2016-11-24 LAB — LIPASE, BLOOD: LIPASE: 20 U/L (ref 11–51)

## 2016-11-24 LAB — AMMONIA: AMMONIA: 58 umol/L — AB (ref 9–35)

## 2016-11-24 MED ORDER — SODIUM CHLORIDE 0.9 % IV SOLN
10.0000 mL/h | Freq: Once | INTRAVENOUS | Status: AC
  Administered 2016-11-24: 10 mL/h via INTRAVENOUS

## 2016-11-24 MED ORDER — SODIUM CHLORIDE 0.9 % IV BOLUS (SEPSIS)
1000.0000 mL | Freq: Once | INTRAVENOUS | Status: AC
  Administered 2016-11-24: 1000 mL via INTRAVENOUS

## 2016-11-24 MED ORDER — PANTOPRAZOLE SODIUM 40 MG IV SOLR: 40.0000 mg | Freq: Two times a day (BID) | INTRAVENOUS | Status: DC

## 2016-11-24 MED ORDER — SODIUM CHLORIDE 0.9 % IV SOLN
80.0000 mg | Freq: Once | INTRAVENOUS | Status: AC
  Administered 2016-11-24: 80 mg via INTRAVENOUS
  Filled 2016-11-24: qty 80

## 2016-11-24 MED ORDER — SODIUM CHLORIDE 0.9 % IV SOLN
50.0000 ug/h | INTRAVENOUS | Status: DC
  Administered 2016-11-24 – 2016-11-27 (×5): 50 ug/h via INTRAVENOUS
  Filled 2016-11-24 (×12): qty 1

## 2016-11-24 MED ORDER — PANTOPRAZOLE SODIUM 40 MG IV SOLR
8.0000 mg/h | INTRAVENOUS | Status: DC
  Administered 2016-11-24 – 2016-11-26 (×4): 8 mg/h via INTRAVENOUS
  Filled 2016-11-24 (×9): qty 80

## 2016-11-24 MED ORDER — OCTREOTIDE LOAD VIA INFUSION
50.0000 ug | Freq: Once | INTRAVENOUS | Status: AC
  Administered 2016-11-24: 50 ug via INTRAVENOUS
  Filled 2016-11-24: qty 25

## 2016-11-24 MED ORDER — DEXTROSE 5 % IV SOLN
1.0000 g | Freq: Once | INTRAVENOUS | Status: AC
  Administered 2016-11-24: 1 g via INTRAVENOUS
  Filled 2016-11-24: qty 10

## 2016-11-24 MED ORDER — SODIUM CHLORIDE 0.9 % IV SOLN
INTRAVENOUS | Status: DC
  Administered 2016-11-25 – 2016-11-26 (×4): via INTRAVENOUS

## 2016-11-24 NOTE — ED Triage Notes (Signed)
Per EMS:  Pt presents to ED for assessment after having an episode of light-headedness after having n/v/d x 2 days.  Denies any nausea and no vomiting with EMS en route.  BP 104/50.  CBG 201.

## 2016-11-24 NOTE — ED Provider Notes (Signed)
[facility] DEPT Provider Note   CSN: [id number] Arrival date & time: 11/24/16  2126     History   Chief Complaint Chief Complaint  Patient presents with  . Near Syncope    HPI DNAE KURMAN is a 82 y.o. female.  The history is provided by the patient, a relative and medical records. The history is limited by the condition of the patient.  Near Syncope  This is a new problem. The current episode started 1 to 2 hours ago. Associated symptoms include shortness of breath.     This is an 82 year old female with a past medical history of diabetes, hypertension, or disease who presents today with near syncope. This occurred apparently prior to arrival. Patient states she was on the commode whenever this happened. She states over the last 3 days she has noticed that she has had emesis that appears dark like jam. She also endorses dark stool during this time as well. She has had subjective fevers associated with this as well. Patient endorses a prior history of liver issues which her family states is been worked up in the past but no clear etiology has been found. I am unable to find clarification of this and the record in our system or her own care everywhere. Patient endorses muscle pain in her left upper back. This hurts with palpation. She endorses increased swelling in her lower extremity bilaterally. Also has jaundice as well. Exerting herself makes her symptoms worse.  Past Medical History:  Diagnosis Date  . Asthma   . Diabetes mellitus without complication ([place])   . Hypertension   . Thyroid disease     Patient Active Problem List   Diagnosis Date Noted  . Sepsis ([place]) 04/23/2016  . UTI (lower urinary tract infection) 04/23/2016  . Hyponatremia 04/23/2016  . Hypokalemia 04/23/2016  . Hypocalcemia 04/23/2016  . Microcytic anemia 04/23/2016  . Trichomonas infection 04/23/2016    Past Surgical History:  Procedure Laterality Date  . APPENDECTOMY    . CHOLECYSTECTOMY     . THYROID SURGERY      OB History    No data available       Home Medications    Prior to Admission medications   Medication Sig Start Date End Date Taking? Authorizing Provider  Amlodipine-Valsartan-HCTZ (EXFORGE HCT) 5-160-12.5 MG TABS Take 1 tablet by mouth daily.   Yes Historical Provider, MD  aspirin EC 81 MG tablet Take 81 mg by mouth every evening.    Yes Historical Provider, MD  glipiZIDE (GLUCOTROL XL) 5 MG 24 hr tablet Take 5 mg by mouth daily. 10/04/16  Yes Historical Provider, MD  levothyroxine (SYNTHROID, LEVOTHROID) 125 MCG tablet Take 125 mcg by mouth daily. 10/04/16  Yes Historical Provider, MD  metoprolol succinate (TOPROL-XL) 50 MG 24 hr tablet Take 50 mg by mouth daily. Take with or immediately following a meal.   Yes Historical Provider, MD  ciprofloxacin (CIPRO) 250 MG tablet Take 1 tablet (250 mg total) by mouth 2 (two) times daily. X 4 more days Patient not taking: Reported on 11/24/2016 04/27/16   Ripudeep Krystal Eaton, MD    Family History Family History  Problem Relation Age of Onset  . Diabetes Maternal Grandmother     Diabetic coma  . Hypertension Paternal Grandmother   . Cancer Neg Hx     Social History Social History  Substance Use Topics  . Smoking status: Never Smoker  . Smokeless tobacco: Never Used  . Alcohol use No  Allergies   Patient has no known allergies.   Review of Systems Review of Systems  Constitutional: Positive for fever (subjective). Negative for chills.  HENT: Negative for congestion and rhinorrhea.   Respiratory: Positive for shortness of breath.   Cardiovascular: Positive for near-syncope.  Gastrointestinal: Positive for blood in stool (Endorses dark stool), nausea and vomiting (Productive of dark emesis).  Genitourinary: Negative for dysuria and hematuria.  Musculoskeletal: Positive for back pain (left-sided).  Skin: Positive for color change (Jaundice).  Neurological: Positive for light-headedness.  All other  systems reviewed and are negative.    Physical Exam Updated Vital Signs BP (!) 90/46   Pulse 70   Temp 97.8 F (36.6 C) (Oral)   Resp 16   SpO2 100%   Physical Exam  Constitutional: She is oriented to person, place, and time. She appears ill. No distress.  HENT:  Head: Normocephalic and atraumatic.  Eyes: Conjunctivae and EOM are normal. Scleral icterus is present.  Neck: Normal range of motion. Neck supple.  Cardiovascular: Normal rate and regular rhythm.   Pulmonary/Chest: Effort normal and breath sounds normal. No respiratory distress.  Abdominal: Soft. She exhibits distension. There is no tenderness.  Musculoskeletal: She exhibits edema (Bilateral pitting up to knee.).  Neurological: She is alert and oriented to person, place, and time.  Skin: Skin is warm and dry.  Psychiatric: She has a normal mood and affect.  Nursing note and vitals reviewed.    ED Treatments / Results  Labs (all labs ordered are listed, but only abnormal results are displayed) Labs Reviewed  CBC - Abnormal; Notable for the following:       Result Value   WBC 13.0 (*)    RBC 1.33 (*)    Hemoglobin 3.5 (*)    HCT 10.7 (*)    All other components within normal limits  AMMONIA - Abnormal; Notable for the following:    Ammonia 58 (*)    All other components within normal limits  COMPREHENSIVE METABOLIC PANEL - Abnormal; Notable for the following:    Sodium 128 (*)    Chloride 100 (*)    CO2 17 (*)    Glucose, Bld 226 (*)    BUN 66 (*)    Creatinine, Ser 1.63 (*)    Calcium 7.6 (*)    Albumin 1.1 (*)    AST 205 (*)    Alkaline Phosphatase 697 (*)    Total Bilirubin 2.2 (*)    GFR calc non Af Amer 27 (*)    GFR calc Af Amer 32 (*)    All other components within normal limits  PROTIME-INR - Abnormal; Notable for the following:    Prothrombin Time 20.1 (*)    All other components within normal limits  CBG MONITORING, ED - Abnormal; Notable for the following:    Glucose-Capillary 220 (*)     All other components within normal limits  I-STAT CG4 LACTIC ACID, ED - Abnormal; Notable for the following:    Lactic Acid, Venous 5.14 (*)    All other components within normal limits  LIPASE, BLOOD  URINALYSIS, ROUTINE W REFLEX MICROSCOPIC  I-STAT CHEM 8, ED  TYPE AND SCREEN  PREPARE RBC (CROSSMATCH)    EKG  EKG Interpretation None       Radiology No results found.  Procedures Procedures (including critical care time)  Medications Ordered in ED Medications  pantoprazole (PROTONIX) 80 mg in sodium chloride 0.9 % 250 mL (0.32 mg/mL) infusion (8 mg/hr Intravenous New Bag/Given  11/24/16 2219)  pantoprazole (PROTONIX) injection 40 mg (not administered)  octreotide (SANDOSTATIN) 2 mcg/mL load via infusion 50 mcg (50 mcg Intravenous Bolus from Bag 11/24/16 2225)    And  octreotide (SANDOSTATIN) 500 mcg in sodium chloride 0.9 % 250 mL (2 mcg/mL) infusion (50 mcg/hr Intravenous New Bag/Given 11/24/16 2224)  sodium chloride 0.9 % bolus 1,000 mL (1,000 mLs Intravenous New Bag/Given 11/24/16 2200)    And  sodium chloride 0.9 % bolus 1,000 mL (1,000 mLs Intravenous New Bag/Given 11/24/16 2205)    And  0.9 %  sodium chloride infusion (not administered)  0.9 %  sodium chloride infusion (not administered)  pantoprazole (PROTONIX) 80 mg in sodium chloride 0.9 % 100 mL IVPB (80 mg Intravenous New Bag/Given 11/24/16 2220)  cefTRIAXone (ROCEPHIN) 1 g in dextrose 5 % 50 mL IVPB (1 g Intravenous New Bag/Given 11/24/16 2205)     Initial Impression / Assessment and Plan / ED Course  I have reviewed the triage vital signs and the nursing notes.  Pertinent labs & imaging results that were available during my care of the patient were reviewed by me and considered in my medical decision making (see chart for details).  Clinical Course     82 year old female resents today with 3 days of dark emesis, jaundice starting 2 days ago, hypotension. She had an episode of near syncope care prior to  arrival. Initial blood pressures are with systolics in the [id number]. She was given 2 boluses of IV fluid. Consent was obtained by me in discussion with the patient to verbalizes understanding of the risks and benefits associated with this. She notably has a lactic acid of 5.14 which is congruent with the subsequent hemoglobin of 3.5.   2 units of blood been ordered. Patient is okay with getting intubated if needed but does not want CPR. I discussed with the gastrologist on call with our facility. He we'll plan to scope the patient in the morning. He is agreeable with [place] starting octreotide, protonic's, Rocephin in case this is secondary to variceal bleeds.  Discussed with hospitalist who is agreeable to admit the patient to the stepdown unit. Patient is in critical condition at the time of admission but remains alert and oriented.  Final Clinical Impressions(s) / ED Diagnoses   Final diagnoses:  Symptomatic anemia  Dark emesis  Hypotension, unspecified hypotension type    New Prescriptions New Prescriptions   No medications on file     Theodosia Quay, MD 11/24/16 [place], MD 11/29/16 [phone]

## 2016-11-24 NOTE — ED Notes (Signed)
Pt's CBG result was 220. RN notified of pt's blood sugar.

## 2016-11-24 NOTE — H&P (Addendum)
STACEE EARP AUQ:[id number] DOB: 11/17/1928 DOA: 11/24/2016     PCP: Thressa Sheller, MD   Outpatient Specialists: [facility]  Patient coming from:   home Lives   With family    Chief Complaint: near syncope  HPI: [name] is a 82 y.o. female with medical history significant of hypogammaglobinemia   DM2, HTN, hypothyroidism, CKD stage III, elevated LFTs thought to be possibly secondary to malignancy of pancreas or biliary, asthma  Presented with near syncope while patient was using the bathroom. Patient reports for past 3 days she has noticed nausea and vomiting switches dark. She has been having dark stools. Patient endorses subjective fevers no chest pain she's been having peripheral edema has been chronic. Patient and family also noticed worsening jaundice. Reports melena for the past 3 days.  Patient endorses weight loss over the past few months. She has dealt with hypoalbuminemia resulting in lower extremity edema she continues to lose weight.   Regarding pertinent Chronic problems: Patient was admitted in May for sepsis secondary to UTI that time her hemoglobin was down to 6.0 sure received 2 units of packed red blood cells She was noted to have elevated GGT ultrasound of right upper quadrant showed no biliary dilatation Iron was for patient to have GI follow-up. Later on after follow-up CT of abdomen and pelvis showed mild intrahepatic biliary dilatation no mass was noted but she had. Aortic lymph nodes which were thought to be reactive versus neoplastic. Patient GGT and alkaline phosphatase continued to rise as well as bilirubin level up to 2.6. Examination has undergone workup by oncology:  patient's SPEP and UPEP did not show any signs of an M protein , serum immunoelectrophoresis shows a polyclonal gammopathy.PET/CT scan did not show any skeletal lesions concerning for multiple myeloma. Her hypergammaglobinemia though to be apparent polyclonal gammopathy.    PET/CT scan showed mildly FDG avid lymphadenopathy in the chest and abdomen.  CA-19-9 elevated to 309 concerning for possible pancreatic or biliary malignancy causing the patient's obstructive LFTs and lymphadenopathy  Patient last colonoscopy was at the age of 50 after which she did not need any further screening last mammogram was in 2005.  IN ER:  Temp (24hrs), Avg:97.8 F (36.6 C), Min:97.8 F (36.6 C), Max:97.8 F (36.6 C)   RR 22 oxygen saturation 100% at bedtime 79 BP initially as low as 70/51 after IV fluids 90/49 Lactic acid 5.14 Family level LVIII Woody 128 glucose 226 creatinine 1.63 albumin 1.1 AST 205 Alk Phosph 697, total bili 2.2 Lipase 20  WBC 13 Hg 3.5 plt 299 Following Medications were ordered in ER: Medications  pantoprazole (PROTONIX) 80 mg in sodium chloride 0.9 % 250 mL (0.32 mg/mL) infusion (8 mg/hr Intravenous New Bag/Given 11/24/16 2219)  pantoprazole (PROTONIX) injection 40 mg (not administered)  octreotide (SANDOSTATIN) 2 mcg/mL load via infusion 50 mcg (50 mcg Intravenous Bolus from Bag 11/24/16 2225)    And  octreotide (SANDOSTATIN) 500 mcg in sodium chloride 0.9 % 250 mL (2 mcg/mL) infusion (50 mcg/hr Intravenous New Bag/Given 11/24/16 2224)  sodium chloride 0.9 % bolus 1,000 mL (1,000 mLs Intravenous New Bag/Given 11/24/16 2200)    And  sodium chloride 0.9 % bolus 1,000 mL (1,000 mLs Intravenous New Bag/Given 11/24/16 2205)    And  0.9 %  sodium chloride infusion (not administered)  0.9 %  sodium chloride infusion (not administered)  pantoprazole (PROTONIX) 80 mg in sodium chloride 0.9 % 100 mL IVPB (80 mg Intravenous New Bag/Given 11/24/16 2220)  cefTRIAXone (ROCEPHIN) 1 g in dextrose 5 % 50 mL IVPB (1 g Intravenous New Bag/Given 11/24/16 2205)     ER provider discussed case with:  Gi who will see patient in AM  Hospitalist was called for admission for symptomatic anemia  Review of Systems:    Pertinent positives include: change in bowel  habits,, weight loss fatigue melena, Constitutional:  No weight loss, night sweats, Fevers, chills,  HEENT:  No headaches, Difficulty swallowing,Tooth/dental problems,Sore throat,  No sneezing, itching, ear ache, nasal congestion, post nasal drip,  Cardio-vascular:  No chest pain, Orthopnea, PND, anasarca, dizziness, palpitations.no Bilateral lower extremity swelling  GI:  No heartburn, indigestion, abdominal pain, nausea, vomiting, diarrhea,  loss of appetite,  blood in stool, hematemesis Resp:  no shortness of breath at rest. No dyspnea on exertion, No excess mucus, no productive cough, No non-productive cough, No coughing up of blood.No change in color of mucus.No wheezing. Skin:  no rash or lesions. No jaundice GU:  no dysuria, change in color of urine, no urgency or frequency. No straining to urinate.  No flank pain.  Musculoskeletal:  No joint pain or no joint swelling. No decreased range of motion. No back pain.  Psych:  No change in mood or affect. No depression or anxiety. No memory loss.  Neuro: no localizing neurological complaints, no tingling, no weakness, no double vision, no gait abnormality, no slurred speech, no confusion  As per HPI otherwise 10 point review of systems negative.   Past Medical History: Past Medical History:  Diagnosis Date  . Asthma   . Diabetes mellitus without complication (HCC)   . Hypertension   . Thyroid disease    Past Surgical History:  Procedure Laterality Date  . APPENDECTOMY    . CHOLECYSTECTOMY    . THYROID SURGERY       Social History:  Ambulatory  Independently     reports that she has never smoked. She has never used smokeless tobacco. She reports that she does not drink alcohol or use drugs.  Allergies:  No Known Allergies     Family History:   Family History  Problem Relation Age of Onset  . Diabetes Maternal Grandmother     Diabetic coma  . Hypertension Paternal Grandmother   . Cancer Neg Hx      Medications: Prior to Admission medications   Medication Sig Start Date End Date Taking? Authorizing Provider  Amlodipine-Valsartan-HCTZ (EXFORGE HCT) 5-160-12.5 MG TABS Take 1 tablet by mouth daily.   Yes Historical Provider, MD  aspirin EC 81 MG tablet Take 81 mg by mouth every evening.    Yes Historical Provider, MD  glipiZIDE (GLUCOTROL XL) 5 MG 24 hr tablet Take 5 mg by mouth daily. 10/04/16  Yes Historical Provider, MD  levothyroxine (SYNTHROID, LEVOTHROID) 125 MCG tablet Take 125 mcg by mouth daily. 10/04/16  Yes Historical Provider, MD  metoprolol succinate (TOPROL-XL) 50 MG 24 hr tablet Take 50 mg by mouth daily. Take with or immediately following a meal.   Yes Historical Provider, MD  ciprofloxacin (CIPRO) 250 MG tablet Take 1 tablet (250 mg total) by mouth 2 (two) times daily. X 4 more days Patient not taking: Reported on 11/24/2016 04/27/16   Ripudeep Jenna Luo, MD    Physical Exam: Patient Vitals for the past 24 hrs:  BP Temp Temp src Pulse Resp SpO2  11/24/16 2230 (!) 90/46 - - 70 16 100 %  11/24/16 2153 - - - - - 99 %  11/24/16 2145 Marland Kitchen)  70/51 - - 74 - 100 %  11/24/16 2127 (!) 105/49 97.8 F (36.6 C) Oral 81 16 99 %  11/24/16 2126 - - - - - 100 %    1. General:  in No Acute distress 2. Psychological: Alert and    Oriented 3. Head/ENT:    Dry Mucous Membranes                          Head Non traumatic, neck supple                           Poor Dentition 4. SKIN:   decreased Skin turgor,  Skin clean Dry and intact no rash, icteric 5. Heart: Regular rate and rhythm no  Murmur, Rub or gallop 6. Lungs:  no wheezes or crackles   7. Abdomen: Soft,  tender,  distended 8. Lower extremities: no clubbing, cyanosis, 2+ edema 9. Neurologically Grossly intact, moving all 4 extremities equally   10. MSK: Normal range of motion   body mass index is unknown because there is no height or weight on file.  Labs on Admission:   Labs on Admission: I have personally reviewed  following labs and imaging studies  CBC:  Recent Labs Lab 11/24/16 2134  WBC 13.0*  HGB 3.5*  HCT 10.7*  MCV 80.5  PLT 993   Basic Metabolic Panel:  Recent Labs Lab 11/24/16 2200  NA 128*  K 4.3  CL 100*  CO2 17*  GLUCOSE 226*  BUN 66*  CREATININE 1.63*  CALCIUM 7.6*   GFR: CrCl cannot be calculated (Unknown ideal weight.). Liver Function Tests:  Recent Labs Lab 11/24/16 2200  AST 205*  ALT 38  ALKPHOS 697*  BILITOT 2.2*  PROT 7.9  ALBUMIN 1.1*    Recent Labs Lab 11/24/16 2200  LIPASE 20    Recent Labs Lab 11/24/16 2208  AMMONIA 58*   Coagulation Profile:  Recent Labs Lab 11/24/16 2200  INR 1.69   Cardiac Enzymes: No results for input(s): CKTOTAL, CKMB, CKMBINDEX, TROPONINI in the last 168 hours. BNP (last 3 results) No results for input(s): PROBNP in the last 8760 hours. HbA1C: No results for input(s): HGBA1C in the last 72 hours. CBG:  Recent Labs Lab 11/24/16 2145  GLUCAP 220*   Lipid Profile: No results for input(s): CHOL, HDL, LDLCALC, TRIG, CHOLHDL, LDLDIRECT in the last 72 hours. Thyroid Function Tests: No results for input(s): TSH, T4TOTAL, FREET4, T3FREE, THYROIDAB in the last 72 hours. Anemia Panel: No results for input(s): VITAMINB12, FOLATE, FERRITIN, TIBC, IRON, RETICCTPCT in the last 72 hours.   '@LABRCNTIP'$ (procalcitonin:4,lacticidven:4) )No results found for this or any previous visit (from the past 240 hour(s)).     UA not ordered  No results found for: HGBA1C  CrCl cannot be calculated (Unknown ideal weight.).  BNP (last 3 results) No results for input(s): PROBNP in the last 8760 hours.   ECG REPORT  Independently reviewed Rate: 82  Rhythm: sinus rhythm  ST&T Change: No acute ischemic changes   QTC 390  There were no vitals filed for this visit.   Cultures:    Component Value Date/Time   SDES URINE, RANDOM 04/25/2016 2244   SPECREQUEST NONE 04/25/2016 2244   CULT <10,000 COLONIES/mL  INSIGNIFICANT GROWTH (A) 04/25/2016 2244   REPTSTATUS 04/27/2016 FINAL 04/25/2016 2244     Radiological Exams on Admission: No results found.  Chart has been reviewed    Assessment/Plan  82 y.o. female with medical history significant of hypogammaglobinemia   DM2, HTN, hypothyroidism, CKD stage III, elevated LFTs thought to be possibly secondary to malignancy of pancreas or biliary, asthma admit for Sepsis,  symptomatic anemia with GI bleed and possible new liver failure  Present on Admission: . Hyponatremia - likely due to hypervolemic state due to hypoalbuneminia . Symptomatic anemia - transfuse 3 units  appreciate GI consult Sepsis - hypothermic, Admit per Sepsis protocol likely source being  intra-abdominal infection  - rehydrate with 26m/kg  - initiate broad spectrum antibiotics  Vancomycin and Zosyn  -  obtain blood cultures  - Obtain serial lactic acid  - Obtain procalcitonin level  - Admit and monitor vital signs closely  - PCCM has been consulted  Sepsis - Repeat Assessment  Performed at:    12:50 AM  Vitals     Blood pressure 94/58, pulse 78, temperature (!) 95.1 F (35.1 C), temperature source Rectal, resp. rate 24, SpO2 100 %.  Heart:     Regular rate and rhythm  Lungs:    CTA  Capillary Refill:   > 2 sec  Peripheral Pulse:   Radial pulse palpable  Skin:     jaundiced   . Leukocytosis evaluate for underlining infection . Acute renal failure (ARF) Will rehydrate obtain urine elctrolytes . Hematemesis/melena - appreciate GI consult continue Protonix drip and octreotide drip admit to stepdown. Foley serial CBC . DM 2 - order SSI  Possible pancreatic mass - given abdominal distention will order CT in Am once patient is more stable  Other plan as per orders.  DVT prophylaxis:  SCD    Code Status: Limited code  as per  family   Family Communication:   Family    at  Bedside  plan of care was discussed with  GWest Carboand Daughter on the phone,   Disposition Plan:   To home once workup is complete and patient is stable                            Consults called: GI    Admission status:  inpatient       Level of care         SDU      I have spent a total of 67 min on this admission    Shriyans Kuenzi 11/25/2016, 1:25 AM    [facility]  Pager [phone]  after 2 AM please page floor coverage PA If 7AM-7PM, please contact the day team taking care of the patient  [email or web address]  Password [email or web address]

## 2016-11-25 ENCOUNTER — Encounter (HOSPITAL_COMMUNITY)
Admission: EM | Disposition: A | Discharge status: Skilled Nursing Facility | Payer: Self-pay | Source: Home / Self Care | Attending: Internal Medicine

## 2016-11-25 ENCOUNTER — Emergency Department (HOSPITAL_COMMUNITY): Payer: Medicare Other

## 2016-11-25 ENCOUNTER — Encounter (HOSPITAL_COMMUNITY): Payer: Self-pay | Admitting: Radiology

## 2016-11-25 ENCOUNTER — Inpatient Hospital Stay (HOSPITAL_COMMUNITY): Payer: Medicare Other

## 2016-11-25 DIAGNOSIS — Z7984 Long term (current) use of oral hypoglycemic drugs: Secondary | ICD-10-CM | POA: Diagnosis not present

## 2016-11-25 DIAGNOSIS — K7460 Unspecified cirrhosis of liver: Secondary | ICD-10-CM | POA: Diagnosis not present

## 2016-11-25 DIAGNOSIS — E1122 Type 2 diabetes mellitus with diabetic chronic kidney disease: Secondary | ICD-10-CM | POA: Diagnosis present

## 2016-11-25 DIAGNOSIS — I8501 Esophageal varices with bleeding: Secondary | ICD-10-CM | POA: Diagnosis not present

## 2016-11-25 DIAGNOSIS — N179 Acute kidney failure, unspecified: Secondary | ICD-10-CM | POA: Diagnosis not present

## 2016-11-25 DIAGNOSIS — R579 Shock, unspecified: Secondary | ICD-10-CM

## 2016-11-25 DIAGNOSIS — E861 Hypovolemia: Secondary | ICD-10-CM | POA: Diagnosis present

## 2016-11-25 DIAGNOSIS — D62 Acute posthemorrhagic anemia: Secondary | ICD-10-CM | POA: Diagnosis not present

## 2016-11-25 DIAGNOSIS — R578 Other shock: Secondary | ICD-10-CM | POA: Diagnosis present

## 2016-11-25 DIAGNOSIS — J45909 Unspecified asthma, uncomplicated: Secondary | ICD-10-CM | POA: Diagnosis present

## 2016-11-25 DIAGNOSIS — R634 Abnormal weight loss: Secondary | ICD-10-CM | POA: Diagnosis present

## 2016-11-25 DIAGNOSIS — D500 Iron deficiency anemia secondary to blood loss (chronic): Secondary | ICD-10-CM | POA: Diagnosis not present

## 2016-11-25 DIAGNOSIS — R109 Unspecified abdominal pain: Secondary | ICD-10-CM | POA: Diagnosis present

## 2016-11-25 DIAGNOSIS — I8500 Esophageal varices without bleeding: Secondary | ICD-10-CM | POA: Diagnosis not present

## 2016-11-25 DIAGNOSIS — G9340 Encephalopathy, unspecified: Secondary | ICD-10-CM

## 2016-11-25 DIAGNOSIS — Q8789 Other specified congenital malformation syndromes, not elsewhere classified: Secondary | ICD-10-CM | POA: Diagnosis not present

## 2016-11-25 DIAGNOSIS — K920 Hematemesis: Secondary | ICD-10-CM | POA: Diagnosis not present

## 2016-11-25 DIAGNOSIS — K3189 Other diseases of stomach and duodenum: Secondary | ICD-10-CM | POA: Diagnosis not present

## 2016-11-25 DIAGNOSIS — I8511 Secondary esophageal varices with bleeding: Secondary | ICD-10-CM | POA: Diagnosis present

## 2016-11-25 DIAGNOSIS — E1165 Type 2 diabetes mellitus with hyperglycemia: Secondary | ICD-10-CM | POA: Diagnosis present

## 2016-11-25 DIAGNOSIS — K766 Portal hypertension: Secondary | ICD-10-CM | POA: Diagnosis present

## 2016-11-25 DIAGNOSIS — K869 Disease of pancreas, unspecified: Secondary | ICD-10-CM | POA: Diagnosis not present

## 2016-11-25 DIAGNOSIS — E871 Hypo-osmolality and hyponatremia: Secondary | ICD-10-CM | POA: Diagnosis present

## 2016-11-25 DIAGNOSIS — Z79899 Other long term (current) drug therapy: Secondary | ICD-10-CM | POA: Diagnosis not present

## 2016-11-25 DIAGNOSIS — E8809 Other disorders of plasma-protein metabolism, not elsewhere classified: Secondary | ICD-10-CM | POA: Diagnosis present

## 2016-11-25 DIAGNOSIS — K269 Duodenal ulcer, unspecified as acute or chronic, without hemorrhage or perforation: Secondary | ICD-10-CM | POA: Diagnosis present

## 2016-11-25 DIAGNOSIS — Z6828 Body mass index (BMI) 28.0-28.9, adult: Secondary | ICD-10-CM | POA: Diagnosis not present

## 2016-11-25 DIAGNOSIS — E039 Hypothyroidism, unspecified: Secondary | ICD-10-CM | POA: Diagnosis present

## 2016-11-25 DIAGNOSIS — N183 Chronic kidney disease, stage 3 (moderate): Secondary | ICD-10-CM | POA: Diagnosis present

## 2016-11-25 DIAGNOSIS — Z7982 Long term (current) use of aspirin: Secondary | ICD-10-CM | POA: Diagnosis not present

## 2016-11-25 DIAGNOSIS — K7469 Other cirrhosis of liver: Secondary | ICD-10-CM | POA: Diagnosis present

## 2016-11-25 DIAGNOSIS — E860 Dehydration: Secondary | ICD-10-CM | POA: Diagnosis present

## 2016-11-25 DIAGNOSIS — K921 Melena: Secondary | ICD-10-CM | POA: Diagnosis not present

## 2016-11-25 DIAGNOSIS — D890 Polyclonal hypergammaglobulinemia: Secondary | ICD-10-CM | POA: Diagnosis present

## 2016-11-25 HISTORY — PX: ESOPHAGOGASTRODUODENOSCOPY: SHX5428

## 2016-11-25 LAB — GLUCOSE, CAPILLARY
GLUCOSE-CAPILLARY: 120 mg/dL — AB (ref 65–99)
Glucose-Capillary: 107 mg/dL — ABNORMAL HIGH (ref 65–99)

## 2016-11-25 LAB — CBC
HCT: 21.3 % — ABNORMAL LOW (ref 36.0–46.0)
HCT: 23.1 % — ABNORMAL LOW (ref 36.0–46.0)
HEMATOCRIT: 22 % — AB (ref 36.0–46.0)
HEMATOCRIT: 22.2 % — AB (ref 36.0–46.0)
HEMOGLOBIN: 8 g/dL — AB (ref 12.0–15.0)
Hemoglobin: 7.3 g/dL — ABNORMAL LOW (ref 12.0–15.0)
Hemoglobin: 7.6 g/dL — ABNORMAL LOW (ref 12.0–15.0)
Hemoglobin: 7.8 g/dL — ABNORMAL LOW (ref 12.0–15.0)
MCH: 28.5 pg (ref 26.0–34.0)
MCH: 28.1 pg (ref 26.0–34.0)
MCH: 28.9 pg (ref 26.0–34.0)
MCH: 28.2 pg (ref 26.0–34.0)
MCHC: 34.3 g/dL (ref 30.0–36.0)
MCHC: 34.5 g/dL (ref 30.0–36.0)
MCHC: 35.1 g/dL (ref 30.0–36.0)
MCHC: 34.6 g/dL (ref 30.0–36.0)
MCV: 83.2 fL (ref 78.0–100.0)
MCV: 81.5 fL (ref 78.0–100.0)
MCV: 82.2 fL (ref 78.0–100.0)
MCV: 81.3 fL (ref 78.0–100.0)
PLATELETS: 205 10*3/uL (ref 150–400)
PLATELETS: 224 10*3/uL (ref 150–400)
PLATELETS: 229 10*3/uL (ref 150–400)
PLATELETS: 226 10*3/uL (ref 150–400)
RBC: 2.56 MIL/uL — AB (ref 3.87–5.11)
RBC: 2.7 MIL/uL — ABNORMAL LOW (ref 3.87–5.11)
RBC: 2.7 MIL/uL — ABNORMAL LOW (ref 3.87–5.11)
RBC: 2.84 MIL/uL — ABNORMAL LOW (ref 3.87–5.11)
RDW: 14.4 % (ref 11.5–15.5)
RDW: 14.5 % (ref 11.5–15.5)
RDW: 14.6 % (ref 11.5–15.5)
RDW: 14.6 % (ref 11.5–15.5)
WBC: 15.5 10*3/uL — ABNORMAL HIGH (ref 4.0–10.5)
WBC: 15 10*3/uL — AB (ref 4.0–10.5)
WBC: 14.9 10*3/uL — AB (ref 4.0–10.5)
WBC: 14.4 10*3/uL — ABNORMAL HIGH (ref 4.0–10.5)

## 2016-11-25 LAB — COMPREHENSIVE METABOLIC PANEL
ALK PHOS: 617 U/L — AB (ref 38–126)
ALT: 37 U/L (ref 14–54)
AST: 195 U/L — AB (ref 15–41)
Albumin: 1.3 g/dL — ABNORMAL LOW (ref 3.5–5.0)
Anion gap: 6 (ref 5–15)
BUN: 63 mg/dL — AB (ref 6–20)
CALCIUM: 7.3 mg/dL — AB (ref 8.9–10.3)
CO2: 19 mmol/L — ABNORMAL LOW (ref 22–32)
CREATININE: 1.51 mg/dL — AB (ref 0.44–1.00)
Chloride: 108 mmol/L (ref 101–111)
GFR, EST AFRICAN AMERICAN: 35 mL/min — AB (ref >60)
GFR, EST NON AFRICAN AMERICAN: 30 mL/min — AB (ref >60)
Glucose, Bld: 152 mg/dL — ABNORMAL HIGH (ref 65–99)
Potassium: 4.1 mmol/L (ref 3.5–5.1)
Sodium: 133 mmol/L — ABNORMAL LOW (ref 135–145)
Total Bilirubin: 3.2 mg/dL — ABNORMAL HIGH (ref 0.3–1.2)
Total Protein: 7.9 g/dL (ref 6.5–8.1)

## 2016-11-25 LAB — URINALYSIS, ROUTINE W REFLEX MICROSCOPIC
BILIRUBIN URINE: NEGATIVE
Glucose, UA: NEGATIVE mg/dL
Ketones, ur: NEGATIVE mg/dL
LEUKOCYTES UA: NEGATIVE
Nitrite: NEGATIVE
PROTEIN: NEGATIVE mg/dL
SPECIFIC GRAVITY, URINE: 1.013 (ref 1.005–1.030)
pH: 5 (ref 5.0–8.0)

## 2016-11-25 LAB — HEMOGLOBIN A1C
Hgb A1c MFr Bld: 5.9 % — ABNORMAL HIGH (ref 4.8–5.6)
Mean Plasma Glucose: 123 mg/dL

## 2016-11-25 LAB — APTT: aPTT: 37 seconds — ABNORMAL HIGH (ref 24–36)

## 2016-11-25 LAB — CREATININE, URINE, RANDOM: CREATININE, URINE: 65.41 mg/dL

## 2016-11-25 LAB — CBC WITH DIFFERENTIAL/PLATELET
BASOS ABS: 0 10*3/uL (ref 0.0–0.1)
BASOS PCT: 0 %
EOS ABS: 0.2 10*3/uL (ref 0.0–0.7)
Eosinophils Relative: 1 %
HCT: 22.4 % — ABNORMAL LOW (ref 36.0–46.0)
Hemoglobin: 7.6 g/dL — ABNORMAL LOW (ref 12.0–15.0)
LYMPHS PCT: 15 %
Lymphs Abs: 2.3 10*3/uL (ref 0.7–4.0)
MCH: 28.9 pg (ref 26.0–34.0)
MCHC: 33.9 g/dL (ref 30.0–36.0)
MCV: 85.2 fL (ref 78.0–100.0)
MONO ABS: 0.6 10*3/uL (ref 0.1–1.0)
Monocytes Relative: 4 %
NEUTROS PCT: 80 %
Neutro Abs: 12.5 10*3/uL — ABNORMAL HIGH (ref 1.7–7.7)
PLATELETS: 189 10*3/uL (ref 150–400)
RBC: 2.63 MIL/uL — AB (ref 3.87–5.11)
RDW: 14.8 % (ref 11.5–15.5)
WBC: 15.6 10*3/uL — AB (ref 4.0–10.5)

## 2016-11-25 LAB — LACTIC ACID, PLASMA
LACTIC ACID, VENOUS: 2 mmol/L — AB (ref 0.5–1.9)
LACTIC ACID, VENOUS: 2.1 mmol/L — AB (ref 0.5–1.9)
LACTIC ACID, VENOUS: 1.4 mmol/L (ref 0.5–1.9)

## 2016-11-25 LAB — PREPARE RBC (CROSSMATCH)

## 2016-11-25 LAB — PROTIME-INR
INR: 1.59
PROTHROMBIN TIME: 19.1 s — AB (ref 11.4–15.2)

## 2016-11-25 LAB — SODIUM, URINE, RANDOM: SODIUM UR: 54 mmol/L

## 2016-11-25 LAB — CBG MONITORING, ED: Glucose-Capillary: 117 mg/dL — ABNORMAL HIGH (ref 65–99)

## 2016-11-25 LAB — PHOSPHORUS: PHOSPHORUS: 4.2 mg/dL (ref 2.5–4.6)

## 2016-11-25 LAB — TSH: TSH: 6.923 u[IU]/mL — AB (ref 0.350–4.500)

## 2016-11-25 LAB — PROCALCITONIN: Procalcitonin: 3.05 ng/mL

## 2016-11-25 LAB — MAGNESIUM: Magnesium: 1.7 mg/dL (ref 1.7–2.4)

## 2016-11-25 LAB — OSMOLALITY, URINE: Osmolality, Ur: 415 mOsm/kg (ref 300–900)

## 2016-11-25 SURGERY — EGD (ESOPHAGOGASTRODUODENOSCOPY)
Anesthesia: Moderate Sedation

## 2016-11-25 MED ORDER — IOPAMIDOL (ISOVUE-300) INJECTION 61%
INTRAVENOUS | Status: AC
  Administered 2016-11-25: 75 mL
  Filled 2016-11-25: qty 75

## 2016-11-25 MED ORDER — SODIUM CHLORIDE 0.9% FLUSH
3.0000 mL | Freq: Two times a day (BID) | INTRAVENOUS | Status: DC
  Administered 2016-11-25 – 2016-11-30 (×11): 3 mL via INTRAVENOUS

## 2016-11-25 MED ORDER — INSULIN ASPART 100 UNIT/ML ~~LOC~~ SOLN
0.0000 [IU] | SUBCUTANEOUS | Status: DC
  Administered 2016-11-26: 2 [IU] via SUBCUTANEOUS
  Administered 2016-11-26 – 2016-11-27 (×2): 1 [IU] via SUBCUTANEOUS
  Administered 2016-11-27: 2 [IU] via SUBCUTANEOUS
  Administered 2016-11-27 – 2016-11-28 (×3): 1 [IU] via SUBCUTANEOUS
  Administered 2016-11-28: 2 [IU] via SUBCUTANEOUS
  Administered 2016-11-28 – 2016-11-29 (×2): 1 [IU] via SUBCUTANEOUS
  Administered 2016-11-29: 3 [IU] via SUBCUTANEOUS
  Administered 2016-11-29: 2 [IU] via SUBCUTANEOUS
  Administered 2016-11-29: 1 [IU] via SUBCUTANEOUS
  Administered 2016-11-30: 2 [IU] via SUBCUTANEOUS
  Administered 2016-11-30 (×2): 1 [IU] via SUBCUTANEOUS

## 2016-11-25 MED ORDER — FENTANYL CITRATE (PF) 100 MCG/2ML IJ SOLN
INTRAMUSCULAR | Status: DC | PRN
  Administered 2016-11-25: 25 ug via INTRAVENOUS

## 2016-11-25 MED ORDER — VANCOMYCIN HCL IN DEXTROSE 1-5 GM/200ML-% IV SOLN: 1000.0000 mg | Freq: Once | INTRAVENOUS | Status: DC

## 2016-11-25 MED ORDER — FENTANYL CITRATE (PF) 100 MCG/2ML IJ SOLN
INTRAMUSCULAR | Status: AC
  Filled 2016-11-25: qty 2

## 2016-11-25 MED ORDER — PIPERACILLIN-TAZOBACTAM IN DEX 2-0.25 GM/50ML IV SOLN
2.2500 g | Freq: Three times a day (TID) | INTRAVENOUS | Status: DC
  Filled 2016-11-25 (×2): qty 50

## 2016-11-25 MED ORDER — DEXTROSE 5 % IV SOLN: 1.0000 g | Freq: Once | INTRAVENOUS | Status: DC

## 2016-11-25 MED ORDER — PIPERACILLIN-TAZOBACTAM IN DEX 2-0.25 GM/50ML IV SOLN
2.2500 g | Freq: Three times a day (TID) | INTRAVENOUS | Status: DC
  Filled 2016-11-25: qty 50

## 2016-11-25 MED ORDER — DEXTROSE 5 % IV SOLN: 2.0000 g | Freq: Once | INTRAVENOUS | Status: DC

## 2016-11-25 MED ORDER — MIDAZOLAM HCL 5 MG/ML IJ SOLN
INTRAMUSCULAR | Status: AC
  Filled 2016-11-25: qty 2

## 2016-11-25 MED ORDER — ALBUMIN HUMAN 5 % IV SOLN
12.5000 g | Freq: Once | INTRAVENOUS | Status: AC
Start: 2016-11-25 — End: 2016-11-25
  Administered 2016-11-25: 12.5 g via INTRAVENOUS
  Filled 2016-11-25: qty 250

## 2016-11-25 MED ORDER — MIDAZOLAM HCL 10 MG/2ML IJ SOLN
INTRAMUSCULAR | Status: DC | PRN
  Administered 2016-11-25: 1 mg via INTRAVENOUS

## 2016-11-25 MED ORDER — LEVOTHYROXINE SODIUM 100 MCG IV SOLR
62.5000 ug | Freq: Every day | INTRAVENOUS | Status: DC
  Administered 2016-11-25 – 2016-11-27 (×3): 62.5 ug via INTRAVENOUS
  Filled 2016-11-25 (×4): qty 5

## 2016-11-25 MED ORDER — VANCOMYCIN HCL IN DEXTROSE 750-5 MG/150ML-% IV SOLN: 750.0000 mg | INTRAVENOUS | Status: DC

## 2016-11-25 MED ORDER — ONDANSETRON HCL 4 MG/2ML IJ SOLN: 4.0000 mg | Freq: Four times a day (QID) | INTRAMUSCULAR | Status: DC | PRN

## 2016-11-25 MED ORDER — PIPERACILLIN-TAZOBACTAM 3.375 G IVPB 30 MIN
3.3750 g | Freq: Once | INTRAVENOUS | Status: AC
  Administered 2016-11-25: 3.375 g via INTRAVENOUS
  Filled 2016-11-25: qty 50

## 2016-11-25 MED ORDER — SODIUM CHLORIDE 0.9 % IV SOLN
Freq: Once | INTRAVENOUS | Status: AC
  Administered 2016-11-25: 01:00:00 via INTRAVENOUS

## 2016-11-25 MED ORDER — PIPERACILLIN-TAZOBACTAM 3.375 G IVPB 30 MIN: 3.3750 g | Freq: Once | INTRAVENOUS | Status: DC

## 2016-11-25 MED ORDER — SODIUM CHLORIDE 0.9 % IV SOLN: INTRAVENOUS | Status: DC

## 2016-11-25 MED ORDER — IOPAMIDOL (ISOVUE-300) INJECTION 61%
INTRAVENOUS | Status: AC
  Filled 2016-11-25: qty 30

## 2016-11-25 MED ORDER — DIPHENHYDRAMINE HCL 25 MG PO CAPS: 25.0000 mg | ORAL_CAPSULE | Freq: Once | ORAL | Status: DC

## 2016-11-25 MED ORDER — ACETAMINOPHEN 325 MG PO TABS: 650.0000 mg | ORAL_TABLET | Freq: Four times a day (QID) | ORAL | Status: DC | PRN

## 2016-11-25 MED ORDER — ACETAMINOPHEN 650 MG RE SUPP: 650.0000 mg | Freq: Four times a day (QID) | RECTAL | Status: DC | PRN

## 2016-11-25 MED ORDER — ONDANSETRON HCL 4 MG PO TABS: 4.0000 mg | ORAL_TABLET | Freq: Four times a day (QID) | ORAL | Status: DC | PRN

## 2016-11-25 MED ORDER — ACETAMINOPHEN 325 MG PO TABS: 650.0000 mg | ORAL_TABLET | Freq: Once | ORAL | Status: DC

## 2016-11-25 NOTE — ED Notes (Signed)
Patient transported to CT 

## 2016-11-25 NOTE — ED Notes (Signed)
Attempted report  Stated that they were too far behind to take it at this time

## 2016-11-25 NOTE — Consult Note (Signed)
PULMONARY / CRITICAL CARE MEDICINE   Name: [name] MRN: [id number] DOB: 09/30/1928    ADMISSION DATE:  11/24/2016 CONSULTATION DATE:  11/25/16  REFERRING MD:  Adela Glimpse - [facility]  CHIEF COMPLAINT:  GI Bleed  HISTORY OF PRESENT ILLNESS:  Pt is encephelopathic; therefore, this HPI is obtained from chart review. [name] is a 82 y.o. female with PMH as outlined below. She presented to [facility] ED 12/29 with near syncope which apparently occurred just prior to arrival when pt was sitting on commode.  For the 3 days prior, she apparently has had emesis that appeared like dark colored jam along with dark colored stools.  She has also had subjective fevers.  In ED, she was found to have Hgb 3.5.  She was started on PRBC transfusion but due to continued hypotension along with waxing and waning mental status, PCCM was asked to see in consultation.  Per family, pt has had worsening jaundice for several days now and she reportedly has a hx of liver and / or pancreatic problems which have never really been addressed (CA 19-9 was elevated in July 2017 which raised concern for pancreatic or biliary malignancy, felt to have hypergammaglobninemia with no clear etiology identified at the time - oncology had recommended EGD / EUS with MRCP / ERCP but this has never been done).  [facility] MD discussed code status with family and decision was made to list pt as DNR / DNI, but vasopressors OK in the event that they are needed for persistent hypotension.  She has 3u PRBC's ordered (1/2 unit in so far).  BP currently 100 systolic.  Anticipate that BP will respond to additional blood as well as 12.5g albumin ordered and hopefully we can avoid central line placement and vasopressor support.  PAST MEDICAL HISTORY :  She  has a past medical history of Asthma; Diabetes mellitus without complication (HCC); Hypertension; and Thyroid disease.  PAST SURGICAL HISTORY: She  has a past surgical history that includes  Appendectomy; Cholecystectomy; and Thyroid surgery.  No Known Allergies  No current facility-administered medications on file prior to encounter.    Current Outpatient Prescriptions on File Prior to Encounter  Medication Sig  . Amlodipine-Valsartan-HCTZ (EXFORGE HCT) 5-160-12.5 MG TABS Take 1 tablet by mouth daily.  Marland Kitchen aspirin EC 81 MG tablet Take 81 mg by mouth every evening.   . metoprolol succinate (TOPROL-XL) 50 MG 24 hr tablet Take 50 mg by mouth daily. Take with or immediately following a meal.  . ciprofloxacin (CIPRO) 250 MG tablet Take 1 tablet (250 mg total) by mouth 2 (two) times daily. X 4 more days (Patient not taking: Reported on 11/24/2016)    FAMILY HISTORY:  Her indicated that the status of her maternal grandmother is unknown. She indicated that the status of her paternal grandmother is unknown. She indicated that the status of her neg hx is unknown.    SOCIAL HISTORY: She  reports that she has never smoked. She has never used smokeless tobacco. She reports that she does not drink alcohol or use drugs.  REVIEW OF SYSTEMS:   Unable to obtain as pt is encephalopathic.  SUBJECTIVE:  Opens eyes to voice and answers basic questions appropriately, then closes eyes again.  VITAL SIGNS: BP 94/58   Pulse 78   Temp (!) 95.1 F (35.1 C) (Rectal)   Resp 24   Ht 4\' 9"  (1.448 m)   Wt 126 lb 1.7 oz (57.2 kg) Comment: from Aug 2017 records  SpO2  100%   BMI 27.29 kg/m   HEMODYNAMICS:    VENTILATOR SETTINGS: FiO2 (%):  [0 %] 0 %  INTAKE / OUTPUT: No intake/output data recorded.   PHYSICAL EXAMINATION: General: Elderly female, in NAD. Neuro: Somnolent but opens eyes to voice.  No focal deficits. HEENT: [place]/AT. PERRL, scleral icterus. Cardiovascular: RRR, no M/R/G.  Lungs: Respirations even and unlabored.  CTA bilaterally, No W/R/R. Abdomen: BS hypoactive, soft, NT/ND.  Abdomen diffusely tender, most significant RUQ. Musculoskeletal: No gross deformities, 2+ non  pitting edema bilaterally.  Skin: Jaundiced, warm, no rashes.  LABS:  BMET  Recent Labs Lab 11/24/16 2200  NA 128*  K 4.3  CL 100*  CO2 17*  BUN 66*  CREATININE 1.63*  GLUCOSE 226*    Electrolytes  Recent Labs Lab 11/24/16 2200  CALCIUM 7.6*    CBC  Recent Labs Lab 11/24/16 2134  WBC 13.0*  HGB 3.5*  HCT 10.7*  PLT 299    Coag's  Recent Labs Lab 11/24/16 2200  INR 1.69    Sepsis Markers  Recent Labs Lab 11/24/16 2217  LATICACIDVEN 5.14*    ABG No results for input(s): PHART, PCO2ART, PO2ART in the last 168 hours.  Liver Enzymes  Recent Labs Lab 11/24/16 2200  AST 205*  ALT 38  ALKPHOS 697*  BILITOT 2.2*  ALBUMIN 1.1*    Cardiac Enzymes No results for input(s): TROPONINI, PROBNP in the last 168 hours.  Glucose  Recent Labs Lab 11/24/16 2145  GLUCAP 220*    Imaging No results found.   STUDIES:  CT A/P 12/29 >  CULTURES: Blood 12/2 >  ANTIBIOTICS: Vanc 12/29 > Zosyn 12/29 >  SIGNIFICANT EVENTS: 12/29 > admit.  LINES/TUBES: None.  DISCUSSION: 82 y.o. F admitted 12/29 with suspected UGIB of unclear etiology.  Due to persistent hypotension, PCCM asked to see.  Full DNR / DNI but OK with pressors after discussions with [facility]. Has hx of hypergammaglobinemia of unclear etiology, elevated CA 19-9, elevated LFT's. Had been seen by oncology in Aug 2017 who recommended EGD / EUS and MRCP / ERCP but this has never been done.  Also considered pursuing bone marrow biopsy if above was still non-diagnostic.   ASSESSMENT / PLAN:  GASTROINTESTINAL A:   Transaminitis - has hx of abnormal LFT's along with pancreatic lesions and elevated CA 19-9.  Has been seen by oncology in the past (last Aug 2017) who recommended that pt have EGD/EUS and MRCP/ERCP to rule out pancreatobiliary malignancy. Nutrition. P:   GI consulted, will see pt in AM. Further recs per them, appreciate the assistance. PPI gtt, octreotide  gtt. NPO.  HEMATOLOGIC / ONCOLOGIC A:   Acute blood loss anemia - unclear etiology at this point. VTE prophylaxis. Hx hypergammglobinemia - elevated kappa and lambda chains but no M spikes on SPEP / UPEP in the past. Cause not identified and possibility of needeing bone marrow biopsy was mentioned (Aug 2017 - seen by Dr. Irene Limbo). P:  Transfuse for Hgb < 7. H/H q6hrs x 4. SCD's only. Day team to please consult oncology.  CARDIOVASCULAR A:  Hypotension - suspect due to acute blood loss. DNR Status. P:  OK to admit to SDU for now. Continue PRBC's and IVF's. 12.5g Albumin x 1. Goal SBP > 90 if mental status normal. Trend troponin, lactate. Hold preadmission amlodipine-valsartan-HCTZ, ASA, Toprol-xl. No CPR / ACLS in the event of arrest.  RENAL A:   Acute on chronic hyponatremia - presumed exacerbated by hypovolemia. AKI - pre-renal; presumed  due to hypovolemia. Pseudohypocalcemia - corrects to 9.92. P:   NS @ 125. Assess ionized calcium.  INFECTIOUS A:   Leukocytosis - presumed acute phase reactant; No indication of infection, but currently already on vanc / zosyn. P:   Monitor clinically. Would consider early d/c abx if no clear etiology identified.  ENDOCRINE A:   DM. Hypothyroidism. P:   SSI. Continue preadmission synthroid, change to IV formulation. Assess TSH. Hold preadmission glipizide.  NEUROLOGIC A:   No acute issues. P:   No interventions required.  PULMONARY A: No acute issues.  DNI Status. P: No interventions required  Family updated: two grandsons updated at bedside.  [facility] MD discussed code status with family and decision was made to list pt as DNR / DNI, but vasopressors OK in the event that they are needed for persistent hypotension.  She has 3u PRBC's ordered (1/2 unit in so far).  BP currently 034 systolic.  Anticipate that BP will respond to additional blood as well as 12.5g albumin ordered and hopefully we can avoid central line  placement and vasopressor support.  OK to admit to SDU for now.  PCCM will be available as needed.  Interdisciplinary Family Meeting v Palliative Care Meeting:  Due by: 12/02/15.  CC time: 40 minutes.   Montey Hora, [facility] Pager: [phone]  or [phone] 11/25/2016, 1:34 AM  Attending Note:  82 year old female with extensive PMH who presents with a GI bleed and a Hg of 3.  Patient improved with transfusion.  On exam, was originally very encephalopathic and airway protection was questionable.  Had a discussion with the family and the decision was made to make patient LCB with no intubation/CPR/cardioversion.  I reviewed CXR myself, no acute disease noted.  Came back and re-evaluated the patient, appears much better post transfusion.  Admit to SDU, continue octreotide, GI to see, H&H as ordered, transfuse as needed, may need to consider palliative care involvement.  PCCM will sign off, please call back if needed.  The patient is critically ill with multiple organ systems failure and requires high complexity decision making for assessment and support, frequent evaluation and titration of therapies, application of advanced monitoring technologies and extensive interpretation of multiple databases.   Critical Care Time devoted to patient care services described in this note is  35  Minutes. This time reflects time of care of this signee Dr Jennet Maduro. This critical care time does not reflect procedure time, or teaching time or supervisory time of PA/NP/Med student/Med Resident etc but could involve care discussion time.  Rush Farmer, M.D. [facility]Critical Care Medicine. Pager: [phone]. After hours pager: [phone].

## 2016-11-25 NOTE — ED Notes (Signed)
Dr hung at bedside with endoscopy team.

## 2016-11-25 NOTE — Op Note (Addendum)
[facility] Patient Name: [name] Procedure Date : 11/25/2016 MRN: [id number] Attending MD: Carol Ada , MD Date of Birth: [birth year] CSN: [id number] Age: 82 Admit Type: Outpatient Procedure:                Upper GI endoscopy Indications:              Hematemesis, Melena Providers:                Carol Ada, MD Referring MD:              Medicines:                Midazolam 1 mg IV, Fentanyl 25 micrograms IV Complications:            No immediate complications. Estimated Blood Loss:     Estimated blood loss was minimal. Procedure:                Pre-Anesthesia Assessment:                           - Prior to the procedure, a History and Physical                            was performed, and patient medications and                            allergies were reviewed. The patient's tolerance of                            previous anesthesia was also reviewed. The risks                            and benefits of the procedure and the sedation                            options and risks were discussed with the patient.                            All questions were answered, and informed consent                            was obtained. Prior Anticoagulants: The patient has                            taken no previous anticoagulant or antiplatelet                            agents. ASA Grade Assessment: III - A patient with                            severe systemic disease. After reviewing the risks                            and benefits, the patient was deemed in  satisfactory condition to undergo the procedure.                           - Sedation was administered by an endoscopy nurse.                            The sedation level attained was moderate.                           After obtaining informed consent, the endoscope was                            passed under direct vision. Throughout the                             procedure, the patient's blood pressure, pulse, and                            oxygen saturations were monitored continuously. The                            Endoscope was introduced through the mouth, and                            advanced to the second part of duodenum. The upper                            GI endoscopy was accomplished without difficulty.                            The patient tolerated the procedure well. Scope In: Scope Out: Findings:      Small (< 5 mm) varices were found in the lower third of the esophagus.       Five bands were successfully placed with complete eradication, resulting       in deflation of varices. There was no bleeding during the procedure.      Mild portal hypertensive gastropathy was found in the entire examined       stomach.      Clotted blood was found in the gastric body.      Multiple diffuse erosions without bleeding were found in the duodenal       bulb.      Small distal esophageal varices were found in the esophagus. No overt       signs of bleeding from the varices, however, there was a significant       amount of blood in the gastric lumen. Multiple large clots were found.       Lavage of the gastric lumen was negative for any ulcerations, erosions,       or vascular abnormalities. The duodenum did not exhibit any evidence of       blood, therefore, the conclusion was that the bleeding originated from       the esophageal varices. No evidence of fundic varices. The varices were       small and they were most likely larger before her significant bleed. Impression:               -  Small (< 5 mm) esophageal varices. Completely                            eradicated. Banded.                           - Portal hypertensive gastropathy.                           - Clotted blood in the gastric body.                           - Duodenal erosions without bleeding.                           - No specimens collected. Moderate Sedation:       Moderate (conscious) sedation was administered by the endoscopy nurse       and supervised by the endoscopist. The following parameters were       monitored: oxygen saturation, heart rate, blood pressure, and response       to care. Recommendation:           - Admit the patient to ICU for ongoing care.                           - NPO.                           - Continue with ceftriaxone 1 gram QD in the                            setting of a variceal bleed.                           - Follow HGB. Do not transfuse above 8-10 g/dL.                           - Repeat EGD with banding in 3-4 weeks.                           - Continue present medications.                           - Patient has a contact number available for                            emergencies. The signs and symptoms of potential                            delayed complications were discussed with the                            patient. Return to normal activities tomorrow.                            Written discharge instructions were provided to the  patient.                           - Return patient to ICU for ongoing care. Procedure Code(s):        --- Professional ---                           [phone], Esophagogastroduodenoscopy, flexible,                            transoral; with band ligation of esophageal/gastric                            varices Diagnosis Code(s):        --- Professional ---                           I85.00, Esophageal varices without bleeding                           K76.6, Portal hypertension                           K31.89, Other diseases of stomach and duodenum                           K92.2, Gastrointestinal hemorrhage, unspecified                           K26.9, Duodenal ulcer, unspecified as acute or                            chronic, without hemorrhage or perforation                           K92.0, Hematemesis                           K92.1, Melena  (includes Hematochezia) CPT copyright 2016 [employer]. All rights reserved. The codes documented in this report are preliminary and upon coder review may  be revised to meet current compliance requirements. Carol Ada, MD Carol Ada, MD 11/25/2016 2:20:37 PM This report has been signed electronically. Number of Addenda: 0

## 2016-11-25 NOTE — ED Notes (Signed)
Labs and blood cultures unable to be drawn due to blood administration

## 2016-11-25 NOTE — ED Notes (Signed)
Manual BP taken of 94/58 in right arm

## 2016-11-25 NOTE — ED Notes (Signed)
Blood paused due to IV blowing

## 2016-11-25 NOTE — Progress Notes (Signed)
Patient and family informed us that she has seen Dr Carol Ada in office consult since the hospitalization this summer. I have contacted Dr Benson Norway so he can see the patient in hospital consultation.   [facility] will sign off for today, but I will be covering Dr Benson Norway starting at [place] today and until 5pm Jan 1st.  Wilfrid Lund, MD [facility] [phone]

## 2016-11-25 NOTE — ED Notes (Signed)
Endo remains at bedside.

## 2016-11-25 NOTE — ED Notes (Signed)
Pt saturated bed with urine, pt cleaned and linens changed.  Pt had small bowel movement with dark red blood noted, PA at bedside.

## 2016-11-25 NOTE — Consult Note (Signed)
Reason for Consult: Hematemesis and melena Referring Physician: [facility]  Wynonia Musty HPI: This is an 82 year old female with a PMH of abnormal liver enzymes of unknown etiology, asthma, HTN, DM, and hyperlipidemia admitted for complaints of melena for the past week and then hematemesis.  She felt weak and with the bleeding she presented to the ER.  Her HGB was noted to be at 3.5 g/dL and then it increased up to 7.6 g/dL with transfusions.  Her platelet counts were normal.  She currently feels well.  No issues with chest pain, but she had DOE.  On 07/11/2016 she was evaluated in the office for abnormal liver enzymes, specifically her AP was at 764.  We had a long discussion and decided not to pursue further work up at her age and other comorbidities.  She did not want to undergo any chemotherapy if a malignancy was identified.  Past Medical History:  Diagnosis Date  . Asthma   . Diabetes mellitus without complication ([place])   . Hypertension   . Thyroid disease     Past Surgical History:  Procedure Laterality Date  . APPENDECTOMY    . CHOLECYSTECTOMY    . THYROID SURGERY      Family History  Problem Relation Age of Onset  . Diabetes Maternal Grandmother     Diabetic coma  . Hypertension Paternal Grandmother   . Cancer Neg Hx     Social History:  reports that she has never smoked. She has never used smokeless tobacco. She reports that she does not drink alcohol or use drugs.  Allergies: No Known Allergies  Medications:  Scheduled: . acetaminophen  650 mg Oral Once  . diphenhydrAMINE  25 mg Oral Once  . insulin aspart  0-9 Units Subcutaneous Q4H  . iopamidol      . levothyroxine  62.5 mcg Intravenous Daily  . [START ON 11/28/2016] pantoprazole  40 mg Intravenous Q12H  . sodium chloride flush  3 mL Intravenous Q12H  . [START ON 11/27/2016] vancomycin  750 mg Intravenous Q48H   Continuous: . sodium chloride 125 mL/hr at 11/25/16 0400  . octreotide  (SANDOSTATIN)     IV infusion 50 mcg/hr (11/24/16 2224)  . pantoprozole (PROTONIX) infusion 8 mg/hr (11/25/16 0911)  . piperacillin-tazobactam (ZOSYN)  IV    . vancomycin Stopped (11/25/16 0115)    Results for orders placed or performed during the hospital encounter of 11/24/16 (from the past 24 hour(s))  CBC     Status: Abnormal   Collection Time: 11/24/16  9:34 PM  Result Value Ref Range   WBC 13.0 (H) 4.0 - 10.5 K/uL   RBC 1.33 (L) 3.87 - 5.11 MIL/uL   Hemoglobin 3.5 (LL) 12.0 - 15.0 g/dL   HCT 10.7 (L) 36.0 - 46.0 %   MCV 80.5 78.0 - 100.0 fL   MCH 26.3 26.0 - 34.0 pg   MCHC 32.7 30.0 - 36.0 g/dL   RDW 14.8 11.5 - 15.5 %   Platelets 299 150 - 400 K/uL  CBG monitoring, ED     Status: Abnormal   Collection Time: 11/24/16  9:45 PM  Result Value Ref Range   Glucose-Capillary 220 (H) 65 - 99 mg/dL  Comprehensive metabolic panel     Status: Abnormal   Collection Time: 11/24/16 10:00 PM  Result Value Ref Range   Sodium 128 (L) 135 - 145 mmol/L   Potassium 4.3 3.5 - 5.1 mmol/L   Chloride 100 (L) 101 - 111 mmol/L  CO2 17 (L) 22 - 32 mmol/L   Glucose, Bld 226 (H) 65 - 99 mg/dL   BUN 66 (H) 6 - 20 mg/dL   Creatinine, Ser 1.63 (H) 0.44 - 1.00 mg/dL   Calcium 7.6 (L) 8.9 - 10.3 mg/dL   Total Protein 7.9 6.5 - 8.1 g/dL   Albumin 1.1 (L) 3.5 - 5.0 g/dL   AST 205 (H) 15 - 41 U/L   ALT 38 14 - 54 U/L   Alkaline Phosphatase 697 (H) 38 - 126 U/L   Total Bilirubin 2.2 (H) 0.3 - 1.2 mg/dL   GFR calc non Af Amer 27 (L) >60 mL/min   GFR calc Af Amer 32 (L) >60 mL/min   Anion gap 11 5 - 15  Lipase, blood     Status: None   Collection Time: 11/24/16 10:00 PM  Result Value Ref Range   Lipase 20 11 - 51 U/L  Protime-INR     Status: Abnormal   Collection Time: 11/24/16 10:00 PM  Result Value Ref Range   Prothrombin Time 20.1 (H) 11.4 - 15.2 seconds   INR 1.69   Type and screen [place]     Status: None (Preliminary result)   Collection Time: 11/24/16 10:00 PM  Result Value Ref Range    ABO/RH(D) A POS    Antibody Screen NEG    Sample Expiration 11/27/2016    Unit Number [id number]    Blood Component Type RED CELLS,LR    Unit division 00    Status of Unit ISSUED,FINAL    Transfusion Status OK TO TRANSFUSE    Crossmatch Result Compatible    Unit Number [id number]    Blood Component Type RED CELLS,LR    Unit division 00    Status of Unit ISSUED    Transfusion Status OK TO TRANSFUSE    Crossmatch Result Compatible    Unit Number [id number]    Blood Component Type RED CELLS,LR    Unit division 00    Status of Unit ISSUED    Transfusion Status OK TO TRANSFUSE    Crossmatch Result Compatible   Ammonia     Status: Abnormal   Collection Time: 11/24/16 10:08 PM  Result Value Ref Range   Ammonia 58 (H) 9 - 35 umol/L  I-Stat CG4 Lactic Acid, ED  (not at [facility])     Status: Abnormal   Collection Time: 11/24/16 10:17 PM  Result Value Ref Range   Lactic Acid, Venous 5.14 (HH) 0.5 - 1.9 mmol/L   Comment NOTIFIED PHYSICIAN   Prepare RBC     Status: None   Collection Time: 11/24/16 11:10 PM  Result Value Ref Range   Order Confirmation ORDER PROCESSED BY BLOOD BANK   Prepare RBC     Status: None   Collection Time: 11/25/16  1:30 AM  Result Value Ref Range   Order Confirmation ORDER PROCESSED BY BLOOD BANK   Urinalysis, Routine w reflex microscopic     Status: Abnormal   Collection Time: 11/25/16  2:27 AM  Result Value Ref Range   Color, Urine AMBER (A) YELLOW   APPearance CLEAR CLEAR   Specific Gravity, Urine 1.013 1.005 - 1.030   pH 5.0 5.0 - 8.0   Glucose, UA NEGATIVE NEGATIVE mg/dL   Hgb urine dipstick MODERATE (A) NEGATIVE   Bilirubin Urine NEGATIVE NEGATIVE   Ketones, ur NEGATIVE NEGATIVE mg/dL   Protein, ur NEGATIVE NEGATIVE mg/dL   Nitrite NEGATIVE NEGATIVE   Leukocytes, UA NEGATIVE NEGATIVE  RBC / HPF 0-5 0 - 5 RBC/hpf   WBC, UA 0-5 0 - 5 WBC/hpf   Bacteria, UA RARE (A) NONE SEEN   Squamous Epithelial / LPF 0-5 (A) NONE SEEN   Mucous PRESENT     Hyaline Casts, UA PRESENT   Creatinine, urine, random     Status: None   Collection Time: 11/25/16  2:27 AM  Result Value Ref Range   Creatinine, Urine 65.41 mg/dL  Sodium, urine, random     Status: None   Collection Time: 11/25/16  2:27 AM  Result Value Ref Range   Sodium, Ur 54 mmol/L  Osmolality, urine     Status: None   Collection Time: 11/25/16  2:27 AM  Result Value Ref Range   Osmolality, Ur 415 300 - 900 mOsm/kg  Protime-INR     Status: Abnormal   Collection Time: 11/25/16  8:40 AM  Result Value Ref Range   Prothrombin Time 19.1 (H) 11.4 - 15.2 seconds   INR 1.59   Comprehensive metabolic panel     Status: Abnormal   Collection Time: 11/25/16  8:40 AM  Result Value Ref Range   Sodium 133 (L) 135 - 145 mmol/L   Potassium 4.1 3.5 - 5.1 mmol/L   Chloride 108 101 - 111 mmol/L   CO2 19 (L) 22 - 32 mmol/L   Glucose, Bld 152 (H) 65 - 99 mg/dL   BUN 63 (H) 6 - 20 mg/dL   Creatinine, Ser 1.51 (H) 0.44 - 1.00 mg/dL   Calcium 7.3 (L) 8.9 - 10.3 mg/dL   Total Protein 7.9 6.5 - 8.1 g/dL   Albumin 1.3 (L) 3.5 - 5.0 g/dL   AST 195 (H) 15 - 41 U/L   ALT 37 14 - 54 U/L   Alkaline Phosphatase 617 (H) 38 - 126 U/L   Total Bilirubin 3.2 (H) 0.3 - 1.2 mg/dL   GFR calc non Af Amer 30 (L) >60 mL/min   GFR calc Af Amer 35 (L) >60 mL/min   Anion gap 6 5 - 15  Procalcitonin     Status: None   Collection Time: 11/25/16  8:40 AM  Result Value Ref Range   Procalcitonin 3.05 ng/mL  APTT     Status: Abnormal   Collection Time: 11/25/16  8:40 AM  Result Value Ref Range   aPTT 37 (H) 24 - 36 seconds  Magnesium     Status: None   Collection Time: 11/25/16  8:40 AM  Result Value Ref Range   Magnesium 1.7 1.7 - 2.4 mg/dL  Phosphorus     Status: None   Collection Time: 11/25/16  8:40 AM  Result Value Ref Range   Phosphorus 4.2 2.5 - 4.6 mg/dL  TSH     Status: Abnormal   Collection Time: 11/25/16  8:40 AM  Result Value Ref Range   TSH 6.923 (H) 0.350 - 4.500 uIU/mL  CBC with  Differential     Status: Abnormal   Collection Time: 11/25/16  8:40 AM  Result Value Ref Range   WBC 15.6 (H) 4.0 - 10.5 K/uL   RBC 2.63 (L) 3.87 - 5.11 MIL/uL   Hemoglobin 7.6 (L) 12.0 - 15.0 g/dL   HCT 22.4 (L) 36.0 - 46.0 %   MCV 85.2 78.0 - 100.0 fL   MCH 28.9 26.0 - 34.0 pg   MCHC 33.9 30.0 - 36.0 g/dL   RDW 14.8 11.5 - 15.5 %   Platelets 189 150 - 400 K/uL   Neutrophils Relative %  80 %   Lymphocytes Relative 15 %   Monocytes Relative 4 %   Eosinophils Relative 1 %   Basophils Relative 0 %   Neutro Abs 12.5 (H) 1.7 - 7.7 K/uL   Lymphs Abs 2.3 0.7 - 4.0 K/uL   Monocytes Absolute 0.6 0.1 - 1.0 K/uL   Eosinophils Absolute 0.2 0.0 - 0.7 K/uL   Basophils Absolute 0.0 0.0 - 0.1 K/uL   RBC Morphology POLYCHROMASIA PRESENT   Lactic acid, plasma     Status: Abnormal   Collection Time: 11/25/16  8:49 AM  Result Value Ref Range   Lactic Acid, Venous 2.0 (HH) 0.5 - 1.9 mmol/L  Lactic acid, plasma     Status: Abnormal   Collection Time: 11/25/16 10:00 AM  Result Value Ref Range   Lactic Acid, Venous 2.1 (HH) 0.5 - 1.9 mmol/L  CBC     Status: Abnormal   Collection Time: 11/25/16 10:01 AM  Result Value Ref Range   WBC 15.5 (H) 4.0 - 10.5 K/uL   RBC 2.56 (L) 3.87 - 5.11 MIL/uL   Hemoglobin 7.3 (L) 12.0 - 15.0 g/dL   HCT 21.3 (L) 36.0 - 46.0 %   MCV 83.2 78.0 - 100.0 fL   MCH 28.5 26.0 - 34.0 pg   MCHC 34.3 30.0 - 36.0 g/dL   RDW 14.4 11.5 - 15.5 %   Platelets 205 150 - 400 K/uL     Ct Abdomen Pelvis W Contrast  Result Date: 11/25/2016 CLINICAL DATA:  Patient with diffuse abdominal pain and bloody stools. EXAM: CT ABDOMEN AND PELVIS WITH CONTRAST TECHNIQUE: Multidetector CT imaging of the abdomen and pelvis was performed using the standard protocol following bolus administration of intravenous contrast. CONTRAST:  53mL ISOVUE-300 IOPAMIDOL (ISOVUE-300) INJECTION 61% COMPARISON:  CT abdomen pelvis 06/03/2016 FINDINGS: Lower chest: Normal heart size. Grossly unchanged pericardial  phrenic lymph nodes measuring up to 12 mm (image 9; series 201). Small layering right pleural effusion. Interstitial and ground-glass opacities within the lung bases bilaterally. Hepatobiliary: Liver is nodular in contour. Caudate lobe hypertrophy. Re- demonstrated extensive intrahepatic biliary ductal dilatation. No definite mass is identified. Status post cholecystectomy. Pancreas: Unchanged 10 x 8 mm cystic lesion within the pancreatic head and adjacent 15 x 10 mm cystic lesion within the pancreatic head. No pancreatic ductal dilatation. Spleen: Unremarkable Adrenals/Urinary Tract: The adrenal glands are normal. Kidneys enhance symmetrically with contrast. No hydronephrosis. Urinary bladder is unremarkable. Stomach/Bowel: Circumferential wall thickening of the colon. There is suggestion of associated wall thickening of the small bowel. No free intraperitoneal air. Vascular/Lymphatic: Normal caliber abdominal aorta. Peripheral calcified atherosclerotic plaque. Persistent enlarged retroperitoneal lymph nodes measuring up to 12 mm within the left periaortic location (image 34; series 201). Reproductive: Uterus is unremarkable. Adnexal structures are unremarkable. Other: Small volume ascites. Musculoskeletal: Anasarca. Lower thoracic and lumbar spine degenerative changes. IMPRESSION: Re- demonstrated extensive intrahepatic biliary ductal dilatation which may be secondary to benign or malignant stricture. Interval development of anasarca, mesenteric stranding and small amount of ascites compatible with third-spacing of fluid. There is associated wall thickening of the small and large bowel, likely secondary to the third spacing of fluid. Superimposed enteritis is not excluded. Aortic atherosclerosis. Unchanged enlarged pericardiophrenic and retroperitoneal lymph nodes. These were previously evaluated on PET-CT 06/30/2016. Grossly unchanged cystic lesions within the pancreatic head. Electronically Signed   By: Lovey Newcomer M.D.   On: 11/25/2016 11:40   Dg Chest Port 1 View  Result Date: 11/25/2016 CLINICAL DATA:  Sepsis.  Abdominal  pain. EXAM: PORTABLE CHEST 1 VIEW COMPARISON:  04/23/2016 FINDINGS: Unchanged heart size and mediastinal contours allowing for differences in technique. Mild vascular congestion is new. No confluent airspace disease. No evidence of pleural effusion or pneumothorax. Surgical clips about the thoracic inlet. Unchanged osseous structures. IMPRESSION: Mild vascular congestion, new. Otherwise unchanged exam. No evidence of pneumonia. Electronically Signed   By: Jeb Levering M.D.   On: 11/25/2016 01:35   Dg Abd Portable 1v  Result Date: 11/25/2016 CLINICAL DATA:  Abdominal pain. Epigastric pain for 3 months. Nausea and vomiting. EXAM: PORTABLE ABDOMEN - 1 VIEW COMPARISON:  Visualized abdomen on lumbar spine radiographs 10/26/2016. CT 06/03/2016 FINDINGS: No evidence of bowel obstruction. Similar bowel gas pattern to prior exam with air scattered throughout stomach, large and small bowel. No small bowel dilatation. No evidence of free air. Clips in the right upper quadrant postcholecystectomy. Scattered vascular calcifications. No acute osseous abnormalities. IMPRESSION: Nonobstructive bowel gas pattern. No acute abnormality demonstrated radiographically. Electronically Signed   By: Jeb Levering M.D.   On: 11/25/2016 01:34    ROS:  As stated above in the HPI otherwise negative.  Blood pressure 126/73, pulse 80, temperature 98.4 F (36.9 C), temperature source Rectal, resp. rate 16, height 4\' 9"  (1.448 m), weight 57.2 kg (126 lb 1.7 oz), SpO2 100 %.    PE: Gen: NAD, Alert and Oriented HEENT:  [place]/AT, EOMI Neck: Supple, no LAD Lungs: CTA Bilaterally CV: RRR without M/G/R ABM: Soft, diffusely tender, tympanic, +BS Ext: No C/C/E  Assessment/Plan: 1) Hematemesis. 2) Melena. 3) Severe anemia.   With her presentation further evaluation with an EGD is warranted.  Further  recommendations pending the findings.  Plan: 1) EGD today.  Virgie Chery D 11/25/2016, 12:36 PM

## 2016-11-25 NOTE — Progress Notes (Signed)
Pharmacy Antibiotic Note  [name] is a 82 y.o. female admitted on 11/24/2016 with sepsis and GIB w/ SBP as low as 70s (up to 90s in ED) and Hgb 3.5 (getting transfused).  Pharmacy has been consulted for Vancocin and Zosyn dosing.  Of note SCr 71mo ago was 1, now 1.63.  Plan: Vanc 1g and Zosyn 3.375g IV x1 in ED. Vancomycin 750mg  IV every 48 hours.  Goal trough 15-20 mcg/mL.  Zosyn 2.25g IV every 8 hours.  Height: 4\' 9"  (144.8 cm) Weight: 126 lb 1.7 oz (57.2 kg) (from Aug 2017 records) IBW/kg (Calculated) : 38.6  Temp (24hrs), Avg:96 F (35.6 C), Min:95 F (35 C), Max:97.8 F (36.6 C)   Recent Labs Lab 11/24/16 2134 11/24/16 2200 11/24/16 2217  WBC 13.0*  --   --   CREATININE  --  1.63*  --   LATICACIDVEN  --   --  5.14*    Estimated Creatinine Clearance: 18 mL/min (by C-G formula based on SCr of 1.63 mg/dL (H)).    No Known Allergies    Thank you for allowing pharmacy to be a part of this patient's care.  Wynona Neat, PharmD, BCPS  11/25/2016 1:18 AM

## 2016-11-25 NOTE — ED Notes (Addendum)
No blood draw at this time pt receiving blood transfusion.

## 2016-11-25 NOTE — Progress Notes (Addendum)
PROGRESS NOTE  [name] [id number] DOB: 18-Jul-1928 DOA: 11/24/2016 PCP: Thressa Sheller, MD   LOS: 0 days   Brief Narrative: 82 y.o. female with medical history significant of hypogammaglobinemia, DM2, HTN, hypothyroidism, CKD stage III, elevated LFTs thought to be possibly secondary to malignancy of pancreas or biliary, asthma, was admitted to the hospital on 12/28 after presyncopal episode at home. In the ED she was found to have a low hemoglobin and to the threes. She's been complaining of melena and bright red blood per rectum intermittently over the last several days.  Assessment & Plan: Active Problems:   Hyponatremia   Symptomatic anemia   Leukocytosis   Acute renal failure (ARF) (HCC)   Hematemesis   Hyperglycemia   Acute blood loss anemia due to upper GI bleed with hemorrhagic shock - Patient's hemoglobin on presentation was 3.5, likely in the setting of GI bleed and blood pressure into the 70s with poor mentation. Critical care was initially consulted, have evaluated patient in the emergency room - Consulted gastroenterology, appreciate input. She will be transfused with a goal hemoglobin greater than 7 - Appreciate Dr. Ulyses Amor assistance, patient underwent an upper endoscopy today which showed esophageal varices (small, less than 5 mm). These were banded. It also showed portal hypertensive gastropathy and clotted blood in the gastric body. - continue antibiotics, change to Ceftriaxone due to upper GI bleed - She has no known history of liver cirrhosis, however given liver disease as below with biliary ductal dilatation and concern for cancer may be causing increased portal pressures causing these findings - Continue octreotide, continue PPI  Suspicion for sepsis - On presentation patient has leukocytosis, hypothermia, she is hypotensive, however it does appear that these may be in fact related to #1 rather than real sepsis. I will continue antibiotics in the form of  vancomycin and ceftriaxone for today, we'll consider discontinuing vancomycin 24-48 hours if cultures remain negative. CT abdomen and pelvis without any obvious source of infection, urinalysis is clear, chest x-ray is without any infiltrates  Intra and extra - hepatic biliary ductal dilatation  - These are not new, there initially seen in August when she underwent a CT scan abdomen and pelvis - There has been concern as an outpatient over the last several months given progressive increase in her alkaline phosphatase and bilirubin levels, as well as weight loss that she has underlying malignancy, CA-19-9 was elevated to 309, suggesting possible pancreatic or biliary malignancy  - gastroenterology is following for #1, may need to have this further evaluated with EGD/EUS if family wishes to pursue that route, we'll defer this discussion to directly between family and gastroenterology consultants  Hyponatremia  - Likely in the setting of dehydration, monitor sodium levels after fluids  Acute kidney injury  - Patient with prior normal renal function, likely due to profound anemia as well as hypovolemia, continue IV fluids, keep hemoglobin greater than 7   Diabetes mellitus  - sliding scale   Hypertension - Hold home antihypertensives  Hypothyroidism - Continue Synthroid   DVT prophylaxis: SCDs Code Status: Partial Family Communication: friend bedside Disposition Plan: remain in SDU  Consultants:   PCCM  GI  Procedures:   EGD Impression:                - Small (< 5 mm) esophageal varices. Completely eradicated. Banded. - Portal hypertensive gastropathy. - Clotted blood in the gastric body. - Duodenal erosions without bleeding. - No specimens collected.  Antimicrobials:  Ceftriaxone 12/29 >>  Vancomycin 12/29 >>   Subjective: - feeling better, more alert, denies any nausea or vomiting, has no chest pain or shortness of breath  Objective: Vitals:   11/25/16 1345  11/25/16 1400 11/25/16 1415 11/25/16 1430  BP: 112/72 118/65 115/64 123/68  Pulse: 74 73 73 76  Resp: 25 23 24 14   Temp:      TempSrc:      SpO2: 100% 100% 100% 100%  Weight:      Height:        Intake/Output Summary (Last 24 hours) at 11/25/16 1457 Last data filed at 11/25/16 1439  Gross per 24 hour  Intake             7389 ml  Output              400 ml  Net             6989 ml   Filed Weights   11/25/16 0100  Weight: 57.2 kg (126 lb 1.7 oz)   Examination: Constitutional: NAD, Alert, appropriate Vitals:   11/25/16 1345 11/25/16 1400 11/25/16 1415 11/25/16 1430  BP: 112/72 118/65 115/64 123/68  Pulse: 74 73 73 76  Resp: 25 23 24 14   Temp:      TempSrc:      SpO2: 100% 100% 100% 100%  Weight:      Height:       Eyes: PERRL, lids and conjunctivae normal Respiratory: clear to auscultation bilaterally, no wheezing, no crackles.  Cardiovascular: Regular rate and rhythm, no murmurs / rubs / gallops. No LE edema.  Abdomen: no tenderness. Bowel sounds positive.  Musculoskeletal: no clubbing / cyanosis. Skin: no rashes, lesions, ulcers. No induration Neurologic: non focal    Data Reviewed: I have personally reviewed following labs and imaging studies  CBC:  Recent Labs Lab 11/24/16 2134 11/25/16 0840 11/25/16 1001  WBC 13.0* 15.6* 15.5*  NEUTROABS  --  12.5*  --   HGB 3.5* 7.6* 7.3*  HCT 10.7* 22.4* 21.3*  MCV 80.5 85.2 83.2  PLT 299 189 [id number]   Basic Metabolic Panel:  Recent Labs Lab 11/24/16 2200 11/25/16 0840  NA 128* 133*  K 4.3 4.1  CL 100* 108  CO2 17* 19*  GLUCOSE 226* 152*  BUN 66* 63*  CREATININE 1.63* 1.51*  CALCIUM 7.6* 7.3*  MG  --  1.7  PHOS  --  4.2   GFR: Estimated Creatinine Clearance: 19.4 mL/min (by C-G formula based on SCr of 1.51 mg/dL (H)). Liver Function Tests:  Recent Labs Lab 11/24/16 2200 11/25/16 0840  AST 205* 195*  ALT 38 37  ALKPHOS 697* 617*  BILITOT 2.2* 3.2*  PROT 7.9 7.9  ALBUMIN 1.1* 1.3*    Recent  Labs Lab 11/24/16 2200  LIPASE 20    Recent Labs Lab 11/24/16 2208  AMMONIA 58*   Coagulation Profile:  Recent Labs Lab 11/24/16 2200 11/25/16 0840  INR 1.69 1.59   Cardiac Enzymes: No results for input(s): CKTOTAL, CKMB, CKMBINDEX, TROPONINI in the last 168 hours. BNP (last 3 results) No results for input(s): PROBNP in the last 8760 hours. HbA1C: No results for input(s): HGBA1C in the last 72 hours. CBG:  Recent Labs Lab 11/24/16 2145 11/25/16 1436  GLUCAP 220* 117*   Lipid Profile: No results for input(s): CHOL, HDL, LDLCALC, TRIG, CHOLHDL, LDLDIRECT in the last 72 hours. Thyroid Function Tests:  Recent Labs  11/25/16 0840  TSH 6.923*   Anemia Panel: No results for input(s): VITAMINB12, FOLATE, FERRITIN,  TIBC, IRON, RETICCTPCT in the last 72 hours. Urine analysis:    Component Value Date/Time   COLORURINE AMBER (A) 11/25/2016 0227   APPEARANCEUR CLEAR 11/25/2016 0227   LABSPEC 1.013 11/25/2016 0227   PHURINE 5.0 11/25/2016 0227   GLUCOSEU NEGATIVE 11/25/2016 0227   HGBUR MODERATE (A) 11/25/2016 0227   BILIRUBINUR NEGATIVE 11/25/2016 0227   KETONESUR NEGATIVE 11/25/2016 0227   PROTEINUR NEGATIVE 11/25/2016 0227   UROBILINOGEN 0.2 05/19/2008 0910   NITRITE NEGATIVE 11/25/2016 0227   LEUKOCYTESUR NEGATIVE 11/25/2016 0227   Sepsis Labs: Invalid input(s): PROCALCITONIN, LACTICIDVEN  No results found for this or any previous visit (from the past 240 hour(s)).    Radiology Studies: Ct Abdomen Pelvis W Contrast  Result Date: 11/25/2016 CLINICAL DATA:  Patient with diffuse abdominal pain and bloody stools. EXAM: CT ABDOMEN AND PELVIS WITH CONTRAST TECHNIQUE: Multidetector CT imaging of the abdomen and pelvis was performed using the standard protocol following bolus administration of intravenous contrast. CONTRAST:  36mL ISOVUE-300 IOPAMIDOL (ISOVUE-300) INJECTION 61% COMPARISON:  CT abdomen pelvis 06/03/2016 FINDINGS: Lower chest: Normal heart size.  Grossly unchanged pericardial phrenic lymph nodes measuring up to 12 mm (image 9; series 201). Small layering right pleural effusion. Interstitial and ground-glass opacities within the lung bases bilaterally. Hepatobiliary: Liver is nodular in contour. Caudate lobe hypertrophy. Re- demonstrated extensive intrahepatic biliary ductal dilatation. No definite mass is identified. Status post cholecystectomy. Pancreas: Unchanged 10 x 8 mm cystic lesion within the pancreatic head and adjacent 15 x 10 mm cystic lesion within the pancreatic head. No pancreatic ductal dilatation. Spleen: Unremarkable Adrenals/Urinary Tract: The adrenal glands are normal. Kidneys enhance symmetrically with contrast. No hydronephrosis. Urinary bladder is unremarkable. Stomach/Bowel: Circumferential wall thickening of the colon. There is suggestion of associated wall thickening of the small bowel. No free intraperitoneal air. Vascular/Lymphatic: Normal caliber abdominal aorta. Peripheral calcified atherosclerotic plaque. Persistent enlarged retroperitoneal lymph nodes measuring up to 12 mm within the left periaortic location (image 34; series 201). Reproductive: Uterus is unremarkable. Adnexal structures are unremarkable. Other: Small volume ascites. Musculoskeletal: Anasarca. Lower thoracic and lumbar spine degenerative changes. IMPRESSION: Re- demonstrated extensive intrahepatic biliary ductal dilatation which may be secondary to benign or malignant stricture. Interval development of anasarca, mesenteric stranding and small amount of ascites compatible with third-spacing of fluid. There is associated wall thickening of the small and large bowel, likely secondary to the third spacing of fluid. Superimposed enteritis is not excluded. Aortic atherosclerosis. Unchanged enlarged pericardiophrenic and retroperitoneal lymph nodes. These were previously evaluated on PET-CT 06/30/2016. Grossly unchanged cystic lesions within the pancreatic head.  Electronically Signed   By: Lovey Newcomer M.D.   On: 11/25/2016 11:40   Dg Chest Port 1 View  Result Date: 11/25/2016 CLINICAL DATA:  Sepsis.  Abdominal pain. EXAM: PORTABLE CHEST 1 VIEW COMPARISON:  04/23/2016 FINDINGS: Unchanged heart size and mediastinal contours allowing for differences in technique. Mild vascular congestion is new. No confluent airspace disease. No evidence of pleural effusion or pneumothorax. Surgical clips about the thoracic inlet. Unchanged osseous structures. IMPRESSION: Mild vascular congestion, new. Otherwise unchanged exam. No evidence of pneumonia. Electronically Signed   By: Jeb Levering M.D.   On: 11/25/2016 01:35   Dg Abd Portable 1v  Result Date: 11/25/2016 CLINICAL DATA:  Abdominal pain. Epigastric pain for 3 months. Nausea and vomiting. EXAM: PORTABLE ABDOMEN - 1 VIEW COMPARISON:  Visualized abdomen on lumbar spine radiographs 10/26/2016. CT 06/03/2016 FINDINGS: No evidence of bowel obstruction. Similar bowel gas pattern to prior exam with air scattered  throughout stomach, large and small bowel. No small bowel dilatation. No evidence of free air. Clips in the right upper quadrant postcholecystectomy. Scattered vascular calcifications. No acute osseous abnormalities. IMPRESSION: Nonobstructive bowel gas pattern. No acute abnormality demonstrated radiographically. Electronically Signed   By: Jeb Levering M.D.   On: 11/25/2016 01:34     Scheduled Meds: . acetaminophen  650 mg Oral Once  . diphenhydrAMINE  25 mg Oral Once  . insulin aspart  0-9 Units Subcutaneous Q4H  . iopamidol      . levothyroxine  62.5 mcg Intravenous Daily  . [START ON 11/28/2016] pantoprazole  40 mg Intravenous Q12H  . sodium chloride flush  3 mL Intravenous Q12H  . [START ON 11/27/2016] vancomycin  750 mg Intravenous Q48H   Continuous Infusions: . sodium chloride 125 mL/hr at 11/25/16 1439  . octreotide  (SANDOSTATIN)    IV infusion 50 mcg/hr (11/24/16 2224)  . pantoprozole  (PROTONIX) infusion 8 mg/hr (11/25/16 0911)  . piperacillin-tazobactam (ZOSYN)  IV    . vancomycin Stopped (11/25/16 0115)    Time spent: 35 minutes  Marzetta Board, MD, PhD [facility] Pager [phone]  If 7PM-7AM, please contact night-coverage [email or web address] Password [email or web address] 11/25/2016, 2:57 PM

## 2016-11-25 NOTE — ED Notes (Signed)
Removed bair hugger.

## 2016-11-25 NOTE — ED Notes (Signed)
Warming blanket applied.

## 2016-11-26 DIAGNOSIS — K7460 Unspecified cirrhosis of liver: Secondary | ICD-10-CM

## 2016-11-26 DIAGNOSIS — K869 Disease of pancreas, unspecified: Secondary | ICD-10-CM

## 2016-11-26 DIAGNOSIS — K921 Melena: Secondary | ICD-10-CM

## 2016-11-26 DIAGNOSIS — I8501 Esophageal varices with bleeding: Secondary | ICD-10-CM

## 2016-11-26 DIAGNOSIS — D62 Acute posthemorrhagic anemia: Secondary | ICD-10-CM

## 2016-11-26 DIAGNOSIS — R7989 Other specified abnormal findings of blood chemistry: Secondary | ICD-10-CM

## 2016-11-26 LAB — CBC
HCT: 22.6 % — ABNORMAL LOW (ref 36.0–46.0)
HCT: 23.5 % — ABNORMAL LOW (ref 36.0–46.0)
HCT: 22.2 % — ABNORMAL LOW (ref 36.0–46.0)
Hemoglobin: 7.9 g/dL — ABNORMAL LOW (ref 12.0–15.0)
Hemoglobin: 8.2 g/dL — ABNORMAL LOW (ref 12.0–15.0)
Hemoglobin: 7.6 g/dL — ABNORMAL LOW (ref 12.0–15.0)
MCH: 28.4 pg (ref 26.0–34.0)
MCH: 29 pg (ref 26.0–34.0)
MCH: 28 pg (ref 26.0–34.0)
MCHC: 35 g/dL (ref 30.0–36.0)
MCHC: 34.9 g/dL (ref 30.0–36.0)
MCHC: 34.2 g/dL (ref 30.0–36.0)
MCV: 81.3 fL (ref 78.0–100.0)
MCV: 83 fL (ref 78.0–100.0)
MCV: 81.9 fL (ref 78.0–100.0)
PLATELETS: 240 10*3/uL (ref 150–400)
PLATELETS: 262 10*3/uL (ref 150–400)
Platelets: 236 10*3/uL (ref 150–400)
RBC: 2.78 MIL/uL — AB (ref 3.87–5.11)
RBC: 2.83 MIL/uL — ABNORMAL LOW (ref 3.87–5.11)
RBC: 2.71 MIL/uL — AB (ref 3.87–5.11)
RDW: 14.8 % (ref 11.5–15.5)
RDW: 15.1 % (ref 11.5–15.5)
RDW: 15.1 % (ref 11.5–15.5)
WBC: 13.2 10*3/uL — ABNORMAL HIGH (ref 4.0–10.5)
WBC: 12.5 10*3/uL — AB (ref 4.0–10.5)
WBC: 10.8 10*3/uL — AB (ref 4.0–10.5)

## 2016-11-26 LAB — HEMOGLOBIN AND HEMATOCRIT, BLOOD
HCT: 23.7 % — ABNORMAL LOW (ref 36.0–46.0)
HCT: 21 % — ABNORMAL LOW (ref 36.0–46.0)
Hemoglobin: 8 g/dL — ABNORMAL LOW (ref 12.0–15.0)
Hemoglobin: 7.2 g/dL — ABNORMAL LOW (ref 12.0–15.0)

## 2016-11-26 LAB — GLUCOSE, CAPILLARY
GLUCOSE-CAPILLARY: 104 mg/dL — AB (ref 65–99)
GLUCOSE-CAPILLARY: 96 mg/dL (ref 65–99)
GLUCOSE-CAPILLARY: 171 mg/dL — AB (ref 65–99)
Glucose-Capillary: 95 mg/dL (ref 65–99)
Glucose-Capillary: 139 mg/dL — ABNORMAL HIGH (ref 65–99)

## 2016-11-26 LAB — TYPE AND SCREEN
ABO/RH(D): A POS
ANTIBODY SCREEN: NEGATIVE
UNIT DIVISION: 0
Unit division: 0
Unit division: 0

## 2016-11-26 LAB — BASIC METABOLIC PANEL
Anion gap: 9 (ref 5–15)
BUN: 51 mg/dL — AB (ref 6–20)
CO2: 17 mmol/L — ABNORMAL LOW (ref 22–32)
CREATININE: 1.36 mg/dL — AB (ref 0.44–1.00)
Calcium: 7.4 mg/dL — ABNORMAL LOW (ref 8.9–10.3)
Chloride: 110 mmol/L (ref 101–111)
GFR calc Af Amer: 40 mL/min — ABNORMAL LOW (ref >60)
GFR, EST NON AFRICAN AMERICAN: 34 mL/min — AB (ref >60)
GLUCOSE: 112 mg/dL — AB (ref 65–99)
POTASSIUM: 3.6 mmol/L (ref 3.5–5.1)
SODIUM: 136 mmol/L (ref 135–145)

## 2016-11-26 LAB — HEPATIC FUNCTION PANEL
ALK PHOS: 679 U/L — AB (ref 38–126)
ALT: 39 U/L (ref 14–54)
AST: 188 U/L — ABNORMAL HIGH (ref 15–41)
Albumin: 1.2 g/dL — ABNORMAL LOW (ref 3.5–5.0)
BILIRUBIN DIRECT: 1.5 mg/dL — AB (ref 0.1–0.5)
BILIRUBIN INDIRECT: 1.2 mg/dL — AB (ref 0.3–0.9)
Total Bilirubin: 2.7 mg/dL — ABNORMAL HIGH (ref 0.3–1.2)
Total Protein: 8.2 g/dL — ABNORMAL HIGH (ref 6.5–8.1)

## 2016-11-26 LAB — MRSA PCR SCREENING: MRSA BY PCR: NEGATIVE

## 2016-11-26 MED ORDER — PANTOPRAZOLE SODIUM 40 MG IV SOLR
40.0000 mg | Freq: Two times a day (BID) | INTRAVENOUS | Status: DC
  Administered 2016-11-26 – 2016-11-30 (×9): 40 mg via INTRAVENOUS
  Filled 2016-11-26 (×9): qty 40

## 2016-11-26 MED ORDER — HYDRALAZINE HCL 20 MG/ML IJ SOLN: 2.0000 mg | INTRAMUSCULAR | Status: DC | PRN

## 2016-11-26 MED ORDER — AMLODIPINE BESYLATE 5 MG PO TABS
5.0000 mg | ORAL_TABLET | Freq: Every day | ORAL | Status: DC
  Administered 2016-11-26 – 2016-11-30 (×5): 5 mg via ORAL
  Filled 2016-11-26 (×5): qty 1

## 2016-11-26 MED ORDER — PROPRANOLOL HCL 20 MG PO TABS
20.0000 mg | ORAL_TABLET | Freq: Two times a day (BID) | ORAL | Status: DC
  Administered 2016-11-26 – 2016-11-30 (×9): 20 mg via ORAL
  Filled 2016-11-26 (×9): qty 1

## 2016-11-26 NOTE — Progress Notes (Addendum)
[facility] Progress Note    (covering for Dr. Benson Norway through Jan 1st)  Chief Complaint: esophageal variceal bleed  Subjective  History:  I rec'd signout from Dr Benson Norway last evening and reviewed his consult note, EGD report , lab results and CTAP scan report. There has been melena since the EGD, which is not surprising given the volume of blood in her stomach. She is mostly c/o low back pain laying in bed.  ROS: Cardiovascular:  no chest pain Respiratory: no dyspnea She has lost at least 30# in the last 4 months  Objective:  Med list reviewed  Vital signs in last 24 hrs: Vitals:   11/26/16 0700 11/26/16 0806  BP: (!) 156/68 (!) 145/77  Pulse: 69 75  Resp: 15 14  Temp:  97.7 F (36.5 C)    Physical Exam  Female family member at bedside  HEENT: sclera anicteric, oral mucosa moist without lesions  Neck: supple, no thyromegaly, JVD or lymphadenopathy  Cardiac: RRR without murmurs, S1S2 heard, no peripheral edema  Pulm: clear to auscultation bilaterally, normal RR and effort noted  Abdomen: soft, no tenderness, with active bowel sounds. Left lobe of the liver is enlarged and tender.  Skin; warm and dry, no jaundice or rash Neuro: alert, conversational, no asterixis  Recent Labs:   Recent Labs Lab 11/25/16 1636 11/25/16 2020 11/26/16 0249  WBC 14.9* 14.4* 13.2*  HGB 7.8* 8.0* 7.9*  HCT 22.2* 23.1* 22.6*  PLT 229 226 240    Recent Labs Lab 11/26/16 0249  NA 136  K 3.6  CL 110  CO2 17*  BUN 51*  ALBUMIN 1.2*  ALKPHOS 679*  ALT 39  AST 188*  GLUCOSE 112*    Recent Labs Lab 11/25/16 0840  INR 1.59   Hepatic Function Latest Ref Rng & Units 11/26/2016 11/25/2016 11/24/2016  Total Protein 6.5 - 8.1 g/dL 8.2(H) 7.9 7.9  Albumin 3.5 - 5.0 g/dL 1.2(L) 1.3(L) 1.1(L)  AST 15 - 41 U/L 188(H) 195(H) 205(H)  ALT 14 - 54 U/L 39 37 38  Alk Phosphatase 38 - 126 U/L 679(H) 617(H) 697(H)  Total Bilirubin 0.3 - 1.2 mg/dL 2.7(H) 3.2(H) 2.2(H)  Bilirubin, Direct  0.1 - 0.5 mg/dL 1.5(H) - -    Radiologic studies: Personally reviewed CTAP images   _0 @ Assessment:   1 - Esophageal variceal bleed, stopped after EBL 2 - Acute blood loss anemia, stable after transfusion 3 - Hepatic cirrhosis of unknown cause 4 - Elevated LFTs with extrahepatic biliary obstruction on imaging.  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  Patient reportedly wants no further workup or intervention for this.  Consider biliary stent to prevent cholangitis if bilirubin starts to steadily rise. 5 - minimal peri-hepatic ascites  Plan: 1 -Stop protonix drip and change to twice daily IV 2 - Continue octreotide drip another 24 hrs, then I will re-evaluate 3 - Q 8 hr Hgb/Hct at least until tomorrow 4 - Clear liquid diet today. Hep lock IVF 5 - OK for OOB to chair from my perspective 6 - stop ceftriaxone  Total time 35 minutes, over half spent in chart review and coordinating care plan (which was discussed with patient's nurse) All patient and family questions answered  Nelida Meuse III Pager [phone] Mon-Fri 8a-5p [phone] after 5p, weekends, holidays

## 2016-11-26 NOTE — Progress Notes (Signed)
Pt has increased urinary frequency.

## 2016-11-26 NOTE — Progress Notes (Signed)
MD notified that hgb 7.6 & urine slightly pink at times of incontinence. Black smear stool earlier in the day. Will continue to monitor patient closely.

## 2016-11-26 NOTE — Progress Notes (Signed)
PROGRESS NOTE  [name] [id number] DOB: 12-06-1927 DOA: 11/24/2016 PCP: Thressa Sheller, MD   LOS: 1 day   Brief Narrative: 82 y.o. female with medical history significant of hypogammaglobinemia, DM2, HTN, hypothyroidism, CKD stage III, elevated LFTs thought to be possibly secondary to malignancy of pancreas or biliary, asthma, was admitted to the hospital on 12/28 after presyncopal episode at home. In the ED she was found to have a low hemoglobin and to the threes. She's been complaining of melena and bright red blood per rectum intermittently over the last several days.  Assessment & Plan: Active Problems:   Hyponatremia   Symptomatic anemia   Leukocytosis   Acute renal failure (ARF) (HCC)   Hematemesis   Hyperglycemia   Shock (HCC)   Acute blood loss anemia   Esophageal varices (HCC)   Portal hypertensive gastropathy   Acute blood loss anemia due to upper GI bleed with hemorrhagic shock - Patient's hemoglobin on presentation was 3.5, likely in the setting of GI bleed and blood pressure into the 70s with poor mentation. Critical care was initially consulted, have evaluated patient in the emergency room but patient recovered her blood pressure with fluids and blood products - Appreciate Dr. Ulyses Amor assistance, patient underwent an upper endoscopy 12/29 which showed esophageal varices (small, less than 5 mm). These were banded. It also showed portal hypertensive gastropathy and clotted blood in the gastric body. - She has no known history of liver cirrhosis, however given liver disease as below with biliary ductal dilatation and concern for cancer may be causing increased portal pressures causing these findings - Continue octreotide, continue PPI - Allow clear liquids today - We'll add propranolol to her regimen  Suspicion for sepsis - On presentation patient has leukocytosis, hypothermia, she is hypotensive, however it does appear that these may be in fact related to #1  rather than real sepsis. CT abdomen and pelvis without any obvious source of infection, urinalysis is clear, chest x-ray is without any infiltrates - Cultures have remained negative, sepsis unlikely, we'll discontinue all antibiotics  Intra and extra - hepatic biliary ductal dilatation  - These are not new, there initially seen in August when she underwent a CT scan abdomen and pelvis - There has been concern as an outpatient over the last several months given progressive increase in her alkaline phosphatase and bilirubin levels, as well as weight loss that she has underlying malignancy, CA-19-9 was elevated to 309, suggesting possible pancreatic or biliary malignancy  - gastroenterology is following for #1, may need to have this further evaluated with EGD/EUS if family wishes to pursue that route, we'll defer this discussion to directly between family and Dr. Benson Norway  Hyponatremia  - Likely in the setting of dehydration, monitor sodium levels after fluids  Acute kidney injury  - Patient with prior normal renal function, likely due to profound anemia as well as hypovolemia, continue IV fluids, keep hemoglobin greater than 7   Diabetes mellitus  - sliding scale   Hypertension - Resume home amlodipine given persistent hypertension, add propranolol to her regimen  Hypothyroidism - Continue Synthroid   DVT prophylaxis: SCDs Code Status: Partial Family Communication: No family at bedside Disposition Plan: remain in SDU  Consultants:   PCCM  GI  Procedures:   EGD Impression:                - Small (< 5 mm) esophageal varices. Completely eradicated. Banded. - Portal hypertensive gastropathy. - Clotted blood in the gastric body. -  Duodenal erosions without bleeding. - No specimens collected.  Antimicrobials:  Ceftriaxone 12/29 >> 12/30  Vancomycin 12/29 >> 12/30  Subjective: - She is very hungry this morning and asks  for food  Objective: Vitals:   11/26/16 0500 11/26/16  0600 11/26/16 0700 11/26/16 0806  BP: (!) 157/67 (!) 165/71 (!) 156/68 (!) 145/77  Pulse: 73 73 69 75  Resp: 15 16 15 14   Temp:    97.7 F (36.5 C)  TempSrc:    Oral  SpO2: 100% 100% 100% 100%  Weight:      Height:        Intake/Output Summary (Last 24 hours) at 11/26/16 1158 Last data filed at 11/26/16 1100  Gross per 24 hour  Intake          4157.34 ml  Output             2450 ml  Net          1707.34 ml   Filed Weights   11/25/16 0100 11/25/16 2011 11/26/16 0400  Weight: 57.2 kg (126 lb 1.7 oz) 64.2 kg (141 lb 8.6 oz) 64.5 kg (142 lb 3.2 oz)   Examination: Constitutional: NAD, Alert, appropriate Vitals:   11/26/16 0500 11/26/16 0600 11/26/16 0700 11/26/16 0806  BP: (!) 157/67 (!) 165/71 (!) 156/68 (!) 145/77  Pulse: 73 73 69 75  Resp: 15 16 15 14   Temp:    97.7 F (36.5 C)  TempSrc:    Oral  SpO2: 100% 100% 100% 100%  Weight:      Height:       Eyes: PERRL, lids and conjunctivae normal Respiratory: clear to auscultation bilaterally, no wheezing, no crackles.  Cardiovascular: Regular rate and rhythm, no murmurs / rubs / gallops. No LE edema.  Abdomen: no tenderness. Bowel sounds positive.  Musculoskeletal: no clubbing / cyanosis. Skin: no rashes, lesions, ulcers. No induration Neurologic: non focal    Data Reviewed: I have personally reviewed following labs and imaging studies  CBC:  Recent Labs Lab 11/25/16 0840 11/25/16 1001 11/25/16 1555 11/25/16 1636 11/25/16 2020 11/26/16 0249  WBC 15.6* 15.5* 15.0* 14.9* 14.4* 13.2*  NEUTROABS 12.5*  --   --   --   --   --   HGB 7.6* 7.3* 7.6* 7.8* 8.0* 7.9*  HCT 22.4* 21.3* 22.0* 22.2* 23.1* 22.6*  MCV 85.2 83.2 81.5 82.2 81.3 81.3  PLT 189 205 224 229 226 [id number]   Basic Metabolic Panel:  Recent Labs Lab 11/24/16 2200 11/25/16 0840 11/26/16 0249  NA 128* 133* 136  K 4.3 4.1 3.6  CL 100* 108 110  CO2 17* 19* 17*  GLUCOSE 226* 152* 112*  BUN 66* 63* 51*  CREATININE 1.63* 1.51* 1.36*  CALCIUM 7.6* 7.3*  7.4*  MG  --  1.7  --   PHOS  --  4.2  --    GFR: Estimated Creatinine Clearance: 24.2 mL/min (by C-G formula based on SCr of 1.36 mg/dL (H)). Liver Function Tests:  Recent Labs Lab 11/24/16 2200 11/25/16 0840 11/26/16 0249  AST 205* 195* 188*  ALT 38 37 39  ALKPHOS 697* 617* 679*  BILITOT 2.2* 3.2* 2.7*  PROT 7.9 7.9 8.2*  ALBUMIN 1.1* 1.3* 1.2*    Recent Labs Lab 11/24/16 2200  LIPASE 20    Recent Labs Lab 11/24/16 2208  AMMONIA 58*   Coagulation Profile:  Recent Labs Lab 11/24/16 2200 11/25/16 0840  INR 1.69 1.59   Cardiac Enzymes: No results for input(s): CKTOTAL, CKMB,  CKMBINDEX, TROPONINI in the last 168 hours. BNP (last 3 results) No results for input(s): PROBNP in the last 8760 hours. HbA1C:  Recent Labs  11/25/16 0840  HGBA1C 5.9*   CBG:  Recent Labs Lab 11/25/16 2054 11/25/16 2312 11/26/16 0436 11/26/16 0808 11/26/16 1133  GLUCAP 120* 107* 104* 96 95   Lipid Profile: No results for input(s): CHOL, HDL, LDLCALC, TRIG, CHOLHDL, LDLDIRECT in the last 72 hours. Thyroid Function Tests:  Recent Labs  11/25/16 0840  TSH 6.923*   Anemia Panel: No results for input(s): VITAMINB12, FOLATE, FERRITIN, TIBC, IRON, RETICCTPCT in the last 72 hours. Urine analysis:    Component Value Date/Time   COLORURINE AMBER (A) 11/25/2016 0227   APPEARANCEUR CLEAR 11/25/2016 0227   LABSPEC 1.013 11/25/2016 0227   PHURINE 5.0 11/25/2016 0227   GLUCOSEU NEGATIVE 11/25/2016 0227   HGBUR MODERATE (A) 11/25/2016 0227   BILIRUBINUR NEGATIVE 11/25/2016 0227   KETONESUR NEGATIVE 11/25/2016 0227   PROTEINUR NEGATIVE 11/25/2016 0227   UROBILINOGEN 0.2 05/19/2008 0910   NITRITE NEGATIVE 11/25/2016 0227   LEUKOCYTESUR NEGATIVE 11/25/2016 0227   Sepsis Labs: Invalid input(s): PROCALCITONIN, LACTICIDVEN  Recent Results (from the past 240 hour(s))  Culture, blood (x 2)     Status: None (Preliminary result)   Collection Time: 11/25/16  8:54 AM  Result  Value Ref Range Status   Specimen Description BLOOD RIGHT HAND  Final   Special Requests BOTTLES DRAWN AEROBIC AND ANAEROBIC 5CC  Final   Culture NO GROWTH 1 DAY  Final   Report Status PENDING  Incomplete  Culture, blood (x 2)     Status: None (Preliminary result)   Collection Time: 11/25/16  8:55 AM  Result Value Ref Range Status   Specimen Description BLOOD LEFT HAND  Final   Special Requests BOTTLES DRAWN AEROBIC AND ANAEROBIC 5CC  Final   Culture NO GROWTH 1 DAY  Final   Report Status PENDING  Incomplete  MRSA PCR Screening     Status: None   Collection Time: 11/25/16  9:21 PM  Result Value Ref Range Status   MRSA by PCR NEGATIVE NEGATIVE Final    Comment:        The GeneXpert MRSA Assay (FDA approved for NASAL specimens only), is one component of a comprehensive MRSA colonization surveillance program. It is not intended to diagnose MRSA infection nor to guide or monitor treatment for MRSA infections.       Radiology Studies: Ct Abdomen Pelvis W Contrast  Result Date: 11/25/2016 CLINICAL DATA:  Patient with diffuse abdominal pain and bloody stools. EXAM: CT ABDOMEN AND PELVIS WITH CONTRAST TECHNIQUE: Multidetector CT imaging of the abdomen and pelvis was performed using the standard protocol following bolus administration of intravenous contrast. CONTRAST:  24mL ISOVUE-300 IOPAMIDOL (ISOVUE-300) INJECTION 61% COMPARISON:  CT abdomen pelvis 06/03/2016 FINDINGS: Lower chest: Normal heart size. Grossly unchanged pericardial phrenic lymph nodes measuring up to 12 mm (image 9; series 201). Small layering right pleural effusion. Interstitial and ground-glass opacities within the lung bases bilaterally. Hepatobiliary: Liver is nodular in contour. Caudate lobe hypertrophy. Re- demonstrated extensive intrahepatic biliary ductal dilatation. No definite mass is identified. Status post cholecystectomy. Pancreas: Unchanged 10 x 8 mm cystic lesion within the pancreatic head and adjacent 15 x  10 mm cystic lesion within the pancreatic head. No pancreatic ductal dilatation. Spleen: Unremarkable Adrenals/Urinary Tract: The adrenal glands are normal. Kidneys enhance symmetrically with contrast. No hydronephrosis. Urinary bladder is unremarkable. Stomach/Bowel: Circumferential wall thickening of the colon. There is suggestion  of associated wall thickening of the small bowel. No free intraperitoneal air. Vascular/Lymphatic: Normal caliber abdominal aorta. Peripheral calcified atherosclerotic plaque. Persistent enlarged retroperitoneal lymph nodes measuring up to 12 mm within the left periaortic location (image 34; series 201). Reproductive: Uterus is unremarkable. Adnexal structures are unremarkable. Other: Small volume ascites. Musculoskeletal: Anasarca. Lower thoracic and lumbar spine degenerative changes. IMPRESSION: Re- demonstrated extensive intrahepatic biliary ductal dilatation which may be secondary to benign or malignant stricture. Interval development of anasarca, mesenteric stranding and small amount of ascites compatible with third-spacing of fluid. There is associated wall thickening of the small and large bowel, likely secondary to the third spacing of fluid. Superimposed enteritis is not excluded. Aortic atherosclerosis. Unchanged enlarged pericardiophrenic and retroperitoneal lymph nodes. These were previously evaluated on PET-CT 06/30/2016. Grossly unchanged cystic lesions within the pancreatic head. Electronically Signed   By: Lovey Newcomer M.D.   On: 11/25/2016 11:40   Dg Chest Port 1 View  Result Date: 11/25/2016 CLINICAL DATA:  Sepsis.  Abdominal pain. EXAM: PORTABLE CHEST 1 VIEW COMPARISON:  04/23/2016 FINDINGS: Unchanged heart size and mediastinal contours allowing for differences in technique. Mild vascular congestion is new. No confluent airspace disease. No evidence of pleural effusion or pneumothorax. Surgical clips about the thoracic inlet. Unchanged osseous structures.  IMPRESSION: Mild vascular congestion, new. Otherwise unchanged exam. No evidence of pneumonia. Electronically Signed   By: Jeb Levering M.D.   On: 11/25/2016 01:35   Dg Abd Portable 1v  Result Date: 11/25/2016 CLINICAL DATA:  Abdominal pain. Epigastric pain for 3 months. Nausea and vomiting. EXAM: PORTABLE ABDOMEN - 1 VIEW COMPARISON:  Visualized abdomen on lumbar spine radiographs 10/26/2016. CT 06/03/2016 FINDINGS: No evidence of bowel obstruction. Similar bowel gas pattern to prior exam with air scattered throughout stomach, large and small bowel. No small bowel dilatation. No evidence of free air. Clips in the right upper quadrant postcholecystectomy. Scattered vascular calcifications. No acute osseous abnormalities. IMPRESSION: Nonobstructive bowel gas pattern. No acute abnormality demonstrated radiographically. Electronically Signed   By: Jeb Levering M.D.   On: 11/25/2016 01:34     Scheduled Meds: . acetaminophen  650 mg Oral Once  . amLODipine  5 mg Oral Daily  . diphenhydrAMINE  25 mg Oral Once  . insulin aspart  0-9 Units Subcutaneous Q4H  . levothyroxine  62.5 mcg Intravenous Daily  . pantoprazole  40 mg Intravenous Q12H  . propranolol  20 mg Oral BID  . sodium chloride flush  3 mL Intravenous Q12H  . vancomycin  1,000 mg Intravenous Once  . [START ON 11/27/2016] vancomycin  750 mg Intravenous Q48H   Continuous Infusions: . octreotide  (SANDOSTATIN)    IV infusion 50 mcg/hr (11/26/16 ED:[id number])    Marzetta Board, MD, PhD [facility] Pager [phone]  If 7PM-7AM, please contact night-coverage [email or web address] Password [email or web address] 11/26/2016, 11:58 AM

## 2016-11-27 DIAGNOSIS — K8689 Other specified diseases of pancreas: Secondary | ICD-10-CM

## 2016-11-27 LAB — GLUCOSE, CAPILLARY
GLUCOSE-CAPILLARY: 129 mg/dL — AB (ref 65–99)
GLUCOSE-CAPILLARY: 137 mg/dL — AB (ref 65–99)
GLUCOSE-CAPILLARY: 198 mg/dL — AB (ref 65–99)
GLUCOSE-CAPILLARY: 63 mg/dL — AB (ref 65–99)
Glucose-Capillary: 114 mg/dL — ABNORMAL HIGH (ref 65–99)
Glucose-Capillary: 12 mg/dL — CL (ref 65–99)
Glucose-Capillary: 136 mg/dL — ABNORMAL HIGH (ref 65–99)
Glucose-Capillary: 175 mg/dL — ABNORMAL HIGH (ref 65–99)
Glucose-Capillary: 43 mg/dL — CL (ref 65–99)

## 2016-11-27 LAB — HEMOGLOBIN AND HEMATOCRIT, BLOOD
HCT: 21.3 % — ABNORMAL LOW (ref 36.0–46.0)
HEMATOCRIT: 23.5 % — AB (ref 36.0–46.0)
HEMOGLOBIN: 7.2 g/dL — AB (ref 12.0–15.0)
Hemoglobin: 7.9 g/dL — ABNORMAL LOW (ref 12.0–15.0)

## 2016-11-27 LAB — COMPREHENSIVE METABOLIC PANEL
ALK PHOS: 1138 U/L — AB (ref 38–126)
ALT: 31 U/L (ref 14–54)
ALT: 39 U/L (ref 14–54)
AST: 154 U/L — AB (ref 15–41)
AST: 192 U/L — ABNORMAL HIGH (ref 15–41)
Albumin: 1.1 g/dL — ABNORMAL LOW (ref 3.5–5.0)
Alkaline Phosphatase: 880 U/L — ABNORMAL HIGH (ref 38–126)
Anion gap: 6 (ref 5–15)
Anion gap: 8 (ref 5–15)
BILIRUBIN TOTAL: 2.1 mg/dL — AB (ref 0.3–1.2)
BUN: 24 mg/dL — AB (ref 6–20)
BUN: 28 mg/dL — ABNORMAL HIGH (ref 6–20)
CALCIUM: 7.3 mg/dL — AB (ref 8.9–10.3)
CHLORIDE: 103 mmol/L (ref 101–111)
CO2: 17 mmol/L — ABNORMAL LOW (ref 22–32)
CO2: 19 mmol/L — ABNORMAL LOW (ref 22–32)
CREATININE: 1.19 mg/dL — AB (ref 0.44–1.00)
Calcium: 6.1 mg/dL — CL (ref 8.9–10.3)
Chloride: 98 mmol/L — ABNORMAL LOW (ref 101–111)
Creatinine, Ser: 1.08 mg/dL — ABNORMAL HIGH (ref 0.44–1.00)
GFR calc Af Amer: 52 mL/min — ABNORMAL LOW (ref >60)
GFR calc non Af Amer: 45 mL/min — ABNORMAL LOW (ref >60)
GFR calc non Af Amer: 40 mL/min — ABNORMAL LOW (ref >60)
GFR, EST AFRICAN AMERICAN: 47 mL/min — AB (ref >60)
GLUCOSE: 940 mg/dL — AB (ref 65–99)
Glucose, Bld: 179 mg/dL — ABNORMAL HIGH (ref 65–99)
POTASSIUM: 2.6 mmol/L — AB (ref 3.5–5.1)
Potassium: 2.9 mmol/L — ABNORMAL LOW (ref 3.5–5.1)
Sodium: 121 mmol/L — ABNORMAL LOW (ref 135–145)
Sodium: 130 mmol/L — ABNORMAL LOW (ref 135–145)
TOTAL PROTEIN: 6.8 g/dL (ref 6.5–8.1)
Total Bilirubin: 2.5 mg/dL — ABNORMAL HIGH (ref 0.3–1.2)
Total Protein: 8 g/dL (ref 6.5–8.1)

## 2016-11-27 LAB — CBC
HCT: 24.1 % — ABNORMAL LOW (ref 36.0–46.0)
Hemoglobin: 8.3 g/dL — ABNORMAL LOW (ref 12.0–15.0)
MCH: 28.8 pg (ref 26.0–34.0)
MCHC: 34.4 g/dL (ref 30.0–36.0)
MCV: 83.7 fL (ref 78.0–100.0)
RBC: 2.88 MIL/uL — ABNORMAL LOW (ref 3.87–5.11)
RDW: 15.8 % — AB (ref 11.5–15.5)
WBC: 11.3 10*3/uL — AB (ref 4.0–10.5)

## 2016-11-27 LAB — CALCIUM, IONIZED: CALCIUM, IONIZED, SERUM: 4.5 mg/dL (ref 4.5–5.6)

## 2016-11-27 MED ORDER — POTASSIUM CHLORIDE CRYS ER 20 MEQ PO TBCR
40.0000 meq | EXTENDED_RELEASE_TABLET | ORAL | Status: AC
  Administered 2016-11-27 (×2): 40 meq via ORAL
  Filled 2016-11-27 (×2): qty 2

## 2016-11-27 MED ORDER — DEXTROSE 50 % IV SOLN
INTRAVENOUS | Status: AC
  Administered 2016-11-27 (×2): 25 mL
  Filled 2016-11-27: qty 50

## 2016-11-27 MED ORDER — DEXTROSE 50 % IV SOLN
INTRAVENOUS | Status: AC
  Administered 2016-11-27: 50 mL
  Filled 2016-11-27: qty 50

## 2016-11-27 MED ORDER — DEXTROSE-NACL 5-0.45 % IV SOLN
INTRAVENOUS | Status: DC
  Administered 2016-11-27: 04:00:00 via INTRAVENOUS
  Administered 2016-11-27: 50 mL via INTRAVENOUS
  Administered 2016-11-28: via INTRAVENOUS

## 2016-11-27 NOTE — Progress Notes (Signed)
Report given to [place].  Son called and updated.  Vs stable.  Will continue to monitor at this time. Saunders Revel T

## 2016-11-27 NOTE — Progress Notes (Signed)
PROGRESS NOTE  [name] [id number] DOB: 12-15-1927 DOA: 11/24/2016 PCP: Thressa Sheller, MD   LOS: 2 days   Brief Narrative: 82 y.o. female with medical history significant of hypogammaglobinemia, DM2, HTN, hypothyroidism, CKD stage III, elevated LFTs thought to be possibly secondary to malignancy of pancreas or biliary, asthma, was admitted to the hospital on 12/28 after presyncopal episode at home. In the ED she was found to have a low hemoglobin and to the threes. She's been complaining of melena and bright red blood per rectum intermittently over the last several days.  Assessment & Plan: Active Problems:   Hyponatremia   Symptomatic anemia   Leukocytosis   Acute renal failure (ARF) (HCC)   Hematemesis   Hyperglycemia   Shock (HCC)   Acute blood loss anemia   Esophageal varices (HCC)   Portal hypertensive gastropathy   Acute blood loss anemia due to upper GI bleed with hemorrhagic shock - Patient's hemoglobin on presentation was 3.5, likely in the setting of GI bleed and blood pressure into the 70s with poor mentation. Critical care was initially consulted, have evaluated patient in the emergency room but patient recovered her blood pressure with fluids and blood products - Appreciate Dr. Ulyses Amor assistance, patient underwent an upper endoscopy 12/29 which showed esophageal varices (small, less than 5 mm). These were banded. It also showed portal hypertensive gastropathy and clotted blood in the gastric body. - She has no known history of liver cirrhosis, however given liver disease as below with biliary ductal dilatation and concern for cancer may be causing increased portal pressures causing these findings - stop octreotide, continue PPI - advance diet per GI - added propranolol to her regimen - Transfer to floor today  Abdominal pain - Patient complained of left abdominal pain, initially mentioned to the nurse this new however tells me he going on for about 2  weeks, her abdomen does look a little more distended, we will obtain an abdominal ultrasound to evaluate for possibility of worsening ascites. CT scan done 2 days ago showed "anasarca, mesenteric stranding and small amount of ascites compatible with third-spacing of fluid", as well as "wall thickening of the small and large bowel, likely secondary to the third spacing of fluid". She GI assistance with this matter as well  Suspicion for sepsis - On presentation patient has leukocytosis, hypothermia, she is hypotensive, however it does appear that these may be in fact related to #1 rather than real sepsis. CT abdomen and pelvis without any obvious source of infection, urinalysis is clear, chest x-ray is without any infiltrates - Cultures have remained negative, sepsis unlikely, discontinued all antibiotics  Intra and extra - hepatic biliary ductal dilatation  - These are not new, there initially seen in August when she underwent a CT scan abdomen and pelvis - There has been concern as an outpatient over the last several months given progressive increase in her alkaline phosphatase and bilirubin levels, as well as weight loss that she has underlying malignancy, CA-19-9 was elevated to 309, suggesting possible pancreatic or biliary malignancy  - gastroenterology is following for #1, may need to have this further evaluated with EGD/EUS if family wishes to pursue that route, we'll defer this discussion to directly between family and Dr. Benson Norway  Hyponatremia  - Likely in the setting of dehydration, monitor sodium levels after fluids  Acute kidney injury  - Patient with prior normal renal function, likely due to profound anemia as well as hypovolemia, continue IV fluids, keep hemoglobin greater than  7   Diabetes mellitus  - sliding scale   Hypertension - Resume home amlodipine given persistent hypertension, add propranolol to her regimen  Hypothyroidism - Continue Synthroid   DVT prophylaxis:  SCDs Code Status: Partial Family Communication: No family at bedside Disposition Plan: transfer to telemetry   Consultants:   PCCM  GI  Procedures:   EGD Impression:                - Small (< 5 mm) esophageal varices. Completely eradicated. Banded. - Portal hypertensive gastropathy. - Clotted blood in the gastric body. - Duodenal erosions without bleeding. - No specimens collected.  Antimicrobials:  Ceftriaxone 12/29 >> 12/30  Vancomycin 12/29 >> 12/30  Subjective: - Eating breakfast, denies any nausea or vomiting, complaints of abdominal pain mainly on the left side, states it's about 2 weeks since she's been having this pain  Objective: Vitals:   11/27/16 0706 11/27/16 0723 11/27/16 0801 11/27/16 1028  BP:  (!) 155/68  (!) 164/86  Pulse: (!) 53 (!) 54 62 62  Resp: 11 11 14    Temp:  97.3 F (36.3 C)    TempSrc:  Oral    SpO2: 100% 100% 100%   Weight:      Height:        Intake/Output Summary (Last 24 hours) at 11/27/16 1130 Last data filed at 11/27/16 0800  Gross per 24 hour  Intake             1235 ml  Output                4 ml  Net             1231 ml   Filed Weights   11/25/16 2011 11/26/16 0400 11/27/16 0324  Weight: 64.2 kg (141 lb 8.6 oz) 64.5 kg (142 lb 3.2 oz) 64.5 kg (142 lb 3.2 oz)   Examination: Constitutional: NAD, Alert, appropriate Vitals:   11/27/16 0706 11/27/16 0723 11/27/16 0801 11/27/16 1028  BP:  (!) 155/68  (!) 164/86  Pulse: (!) 53 (!) 54 62 62  Resp: 11 11 14    Temp:  97.3 F (36.3 C)    TempSrc:  Oral    SpO2: 100% 100% 100%   Weight:      Height:       Eyes: PERRL, lids and conjunctivae normal Respiratory: clear to auscultation bilaterally, no wheezing, no crackles.  Cardiovascular: Regular rate and rhythm, no murmurs / rubs / gallops. No LE edema.  Abdomen: minimal tenderness Left quadrants. Bowel sounds positive. Moderately distended Musculoskeletal: no clubbing / cyanosis. Skin: no rashes, lesions, ulcers. No  induration Neurologic: non focal    Data Reviewed: I have personally reviewed following labs and imaging studies  CBC:  Recent Labs Lab 11/25/16 0840  11/25/16 2020 11/26/16 0249 11/26/16 0848 11/26/16 1424 11/26/16 1701 11/26/16 2044 11/27/16 0621  WBC 15.6*  < > 14.4* 13.2* 12.5*  --  10.8*  --  11.3*  NEUTROABS 12.5*  --   --   --   --   --   --   --   --   HGB 7.6*  < > 8.0* 7.9* 8.2* 8.0* 7.6* 7.2* 8.3*  HCT 22.4*  < > 23.1* 22.6* 23.5* 23.7* 22.2* 21.0* 24.1*  MCV 85.2  < > 81.3 81.3 83.0  --  81.9  --  83.7  PLT 189  < > 226 240 236  --  262  --  PLATELET CLUMPS NOTED ON SMEAR  < > =  values in this interval not displayed. Basic Metabolic Panel:  Recent Labs Lab 11/24/16 2200 11/25/16 0840 11/26/16 0249  NA 128* 133* 136  K 4.3 4.1 3.6  CL 100* 108 110  CO2 17* 19* 17*  GLUCOSE 226* 152* 112*  BUN 66* 63* 51*  CREATININE 1.63* 1.51* 1.36*  CALCIUM 7.6* 7.3* 7.4*  MG  --  1.7  --   PHOS  --  4.2  --    GFR: Estimated Creatinine Clearance: 24.2 mL/min (by C-G formula based on SCr of 1.36 mg/dL (H)). Liver Function Tests:  Recent Labs Lab 11/24/16 2200 11/25/16 0840 11/26/16 0249  AST 205* 195* 188*  ALT 38 37 39  ALKPHOS 697* 617* 679*  BILITOT 2.2* 3.2* 2.7*  PROT 7.9 7.9 8.2*  ALBUMIN 1.1* 1.3* 1.2*    Recent Labs Lab 11/24/16 2200  LIPASE 20    Recent Labs Lab 11/24/16 2208  AMMONIA 58*   Coagulation Profile:  Recent Labs Lab 11/24/16 2200 11/25/16 0840  INR 1.69 1.59   Cardiac Enzymes: No results for input(s): CKTOTAL, CKMB, CKMBINDEX, TROPONINI in the last 168 hours. BNP (last 3 results) No results for input(s): PROBNP in the last 8760 hours. HbA1C:  Recent Labs  11/25/16 0840  HGBA1C 5.9*   CBG:  Recent Labs Lab 11/27/16 0006 11/27/16 0030 11/27/16 0304 11/27/16 0347 11/27/16 0758  GLUCAP 12* 63* 43* 136* 114*   Lipid Profile: No results for input(s): CHOL, HDL, LDLCALC, TRIG, CHOLHDL, LDLDIRECT in the  last 72 hours. Thyroid Function Tests:  Recent Labs  11/25/16 0840  TSH 6.923*   Anemia Panel: No results for input(s): VITAMINB12, FOLATE, FERRITIN, TIBC, IRON, RETICCTPCT in the last 72 hours. Urine analysis:    Component Value Date/Time   COLORURINE AMBER (A) 11/25/2016 0227   APPEARANCEUR CLEAR 11/25/2016 0227   LABSPEC 1.013 11/25/2016 0227   PHURINE 5.0 11/25/2016 0227   GLUCOSEU NEGATIVE 11/25/2016 0227   HGBUR MODERATE (A) 11/25/2016 0227   BILIRUBINUR NEGATIVE 11/25/2016 0227   KETONESUR NEGATIVE 11/25/2016 0227   PROTEINUR NEGATIVE 11/25/2016 0227   UROBILINOGEN 0.2 05/19/2008 0910   NITRITE NEGATIVE 11/25/2016 0227   LEUKOCYTESUR NEGATIVE 11/25/2016 0227   Sepsis Labs: Invalid input(s): PROCALCITONIN, LACTICIDVEN  Recent Results (from the past 240 hour(s))  Culture, blood (x 2)     Status: None (Preliminary result)   Collection Time: 11/25/16  8:54 AM  Result Value Ref Range Status   Specimen Description BLOOD RIGHT HAND  Final   Special Requests BOTTLES DRAWN AEROBIC AND ANAEROBIC 5CC  Final   Culture NO GROWTH 1 DAY  Final   Report Status PENDING  Incomplete  Culture, blood (x 2)     Status: None (Preliminary result)   Collection Time: 11/25/16  8:55 AM  Result Value Ref Range Status   Specimen Description BLOOD LEFT HAND  Final   Special Requests BOTTLES DRAWN AEROBIC AND ANAEROBIC 5CC  Final   Culture NO GROWTH 1 DAY  Final   Report Status PENDING  Incomplete  MRSA PCR Screening     Status: None   Collection Time: 11/25/16  9:21 PM  Result Value Ref Range Status   MRSA by PCR NEGATIVE NEGATIVE Final    Comment:        The GeneXpert MRSA Assay (FDA approved for NASAL specimens only), is one component of a comprehensive MRSA colonization surveillance program. It is not intended to diagnose MRSA infection nor to guide or monitor treatment for MRSA infections.  Radiology Studies: No results found.   Scheduled Meds: . acetaminophen   650 mg Oral Once  . amLODipine  5 mg Oral Daily  . diphenhydrAMINE  25 mg Oral Once  . insulin aspart  0-9 Units Subcutaneous Q4H  . levothyroxine  62.5 mcg Intravenous Daily  . pantoprazole  40 mg Intravenous Q12H  . propranolol  20 mg Oral BID  . sodium chloride flush  3 mL Intravenous Q12H  . vancomycin  1,000 mg Intravenous Once   Continuous Infusions: . dextrose 5 % and 0.45% NaCl 50 mL/hr at 11/27/16 [place], MD, PhD [facility] Pager [phone]  If 7PM-7AM, please contact night-coverage [email or web address] Password [email or web address] 11/27/2016, 11:30 AM

## 2016-11-27 NOTE — Progress Notes (Signed)
Upon assessment pt's abdominal appears more distended and tender to touch. Pt complaining of left side flank pain. MD made aware.

## 2016-11-27 NOTE — Progress Notes (Signed)
MD made aware via text page of new lab results from [facility].

## 2016-11-27 NOTE — Progress Notes (Signed)
Hypoglycemic Event  CBG: 43  Treatment: dex 50 ml   Symptoms: non symptomatic  Follow-up CBG: Time: [id number] CBG Result: 136  Possible Reasons for Event: poor oral intake  Comments/MD notified: A. Hugelmeyer  Rec'd verbal order to start D5+0.45 Nacl @50  ml/hr  Gwenevere Ghazi

## 2016-11-27 NOTE — Progress Notes (Addendum)
[facility] Progress Note  Chief Complaint:   Gastrointestinal bleeding  Subjective: No BMs / bleeding today. He left hip and left low back hurt  Objective:  Vital signs in last 24 hours: Temp:  [97.3 F (36.3 C)-98.1 F (36.7 C)] 97.3 F (36.3 C) (12/31 0723) Pulse Rate:  [50-70] 62 (12/31 0801) Resp:  [11-22] 14 (12/31 0801) BP: (125-180)/(56-112) 155/68 (12/31 0723) SpO2:  [99 %-100 %] 100 % (12/31 0801) Weight:  [142 lb 3.2 oz (64.5 kg)] 142 lb 3.2 oz (64.5 kg) (12/31 0324) Last BM Date: 11/26/16 General:   Alert, well-developed, black female in NAD EENT:  Normal hearing, non icteric sclera, conjunctive pink.  Heart:  Regular rate and rhythm; no murmurs. no lower extremity edema Abdomen:  Soft,mildly distended, nontender.  Normal bowel sounds, no masses felt. Neurologic:  Alert and  oriented x4;  grossly normal neurologically. Psych:  Alert and cooperative. Normal mood and affect.   Intake/Output from previous day: 12/30 0701 - 12/31 0700 In: 1225 [P.O.:660; I.V.:565] Out: 854 [Urine:854] Intake/Output this shift: Total I/O In: 442.5 [I.V.:442.5] Out: 0   Lab Results:  Recent Labs  11/26/16 0848  11/26/16 1701 11/26/16 2044 11/27/16 0621  WBC 12.5*  --  10.8*  --  PENDING  HGB 8.2*  < > 7.6* 7.2* 8.3*  HCT 23.5*  < > 22.2* 21.0* 24.1*  PLT 236  --  262  --  PENDING  < > = values in this interval not displayed. BMET  Recent Labs  11/24/16 2200 11/25/16 0840 11/26/16 0249  NA 128* 133* 136  K 4.3 4.1 3.6  CL 100* 108 110  CO2 17* 19* 17*  GLUCOSE 226* 152* 112*  BUN 66* 63* 51*  CREATININE 1.63* 1.51* 1.36*  CALCIUM 7.6* 7.3* 7.4*   LFT  Recent Labs  11/26/16 0249  PROT 8.2*  ALBUMIN 1.2*  AST 188*  ALT 39  ALKPHOS 679*  BILITOT 2.7*  BILIDIR 1.5*  IBILI 1.2*   PT/INR  Recent Labs  11/24/16 2200 11/25/16 0840  LABPROT 20.1* 19.1*  INR 1.69 1.59   Hepatitis Panel No results for input(s): HEPBSAG, HCVAB,  HEPAIGM, HEPBIGM in the last 72 hours.  Ct Abdomen Pelvis W Contrast  Result Date: 11/25/2016 CLINICAL DATA:  Patient with diffuse abdominal pain and bloody stools. EXAM: CT ABDOMEN AND PELVIS WITH CONTRAST TECHNIQUE: Multidetector CT imaging of the abdomen and pelvis was performed using the standard protocol following bolus administration of intravenous contrast. CONTRAST:  65mL ISOVUE-300 IOPAMIDOL (ISOVUE-300) INJECTION 61% COMPARISON:  CT abdomen pelvis 06/03/2016 FINDINGS: Lower chest: Normal heart size. Grossly unchanged pericardial phrenic lymph nodes measuring up to 12 mm (image 9; series 201). Small layering right pleural effusion. Interstitial and ground-glass opacities within the lung bases bilaterally. Hepatobiliary: Liver is nodular in contour. Caudate lobe hypertrophy. Re- demonstrated extensive intrahepatic biliary ductal dilatation. No definite mass is identified. Status post cholecystectomy. Pancreas: Unchanged 10 x 8 mm cystic lesion within the pancreatic head and adjacent 15 x 10 mm cystic lesion within the pancreatic head. No pancreatic ductal dilatation. Spleen: Unremarkable Adrenals/Urinary Tract: The adrenal glands are normal. Kidneys enhance symmetrically with contrast. No hydronephrosis. Urinary bladder is unremarkable. Stomach/Bowel: Circumferential wall thickening of the colon. There is suggestion of associated wall thickening of the small bowel. No free intraperitoneal air. Vascular/Lymphatic: Normal caliber abdominal aorta. Peripheral calcified atherosclerotic plaque. Persistent enlarged retroperitoneal lymph nodes measuring up to 12 mm within the left periaortic location (image 34; series 201). Reproductive:  Uterus is unremarkable. Adnexal structures are unremarkable. Other: Small volume ascites. Musculoskeletal: Anasarca. Lower thoracic and lumbar spine degenerative changes. IMPRESSION: Re- demonstrated extensive intrahepatic biliary ductal dilatation which may be secondary to  benign or malignant stricture. Interval development of anasarca, mesenteric stranding and small amount of ascites compatible with third-spacing of fluid. There is associated wall thickening of the small and large bowel, likely secondary to the third spacing of fluid. Superimposed enteritis is not excluded. Aortic atherosclerosis. Unchanged enlarged pericardiophrenic and retroperitoneal lymph nodes. These were previously evaluated on PET-CT 06/30/2016. Grossly unchanged cystic lesions within the pancreatic head. Electronically Signed   By: Lovey Newcomer M.D.   On: 11/25/2016 11:40    Assessment / Plan:  Covering for Dr. Benson Norway  1. Cryptogenic cirrhosis / esophageal variceal bleed, s/p EGD with banding. Bleeding resolved      -stopping octreotide today      -continue bid PPI 2. Anemia of acute blood loss. hgb stable at 8.3 post transfusion 3. Abnormal LFTs, extensive intrahepatic biliary duct dilation / cystic masses on  on CTscan.     Patient doesn't want further workup.      LOS: 2 days   [name]  11/27/2016, 8:52 AM  Pager number [phone]  I have discussed the case with the PA, and that is the plan I formulated. I personally interviewed and examined the patient.  Variceal bleed has resolved , she is off octreotide and on propranolol with pulse in the 60's  Tolerating clears - will advance to puree diet for 1 week, then regular.  If no overt GI bleeding by tomorrow AM, she can go home for outpatient f/u with Dr Benson Norway.  Will not plan to see tomorrow unless called.  Dr Benson Norway will return on 1/2.   [name] Pager [phone]  Mon-Fri 8a-5p [phone] after 5p, weekends, holidays

## 2016-11-27 NOTE — Progress Notes (Addendum)
Hypoglycemic Event  CBG: 12  Treatment: dex 50 ml  Symptoms: lethargic  Follow-up CBG: Time: 0030 CBG Result: 63  Possible Reasons for Event: clear liquid diet, poor appetite  Comments/MD notified: A. Hugelmeyer    Gwenevere Ghazi

## 2016-11-27 NOTE — Progress Notes (Signed)
CRITICAL VALUE ALERT  Critical value received:  K 2.6, Glucose 940, Calcium 6.1, Albumin too low to measure  Date of notification:  11/27/16  Time of notification:  [id number]  Critical value read back:Yes.    Nurse who received alert:  Ronette Deter  MD notified (1st page):  Gherghe  Time of first page:  1344  MD notified (2nd page):  Time of second page:  Responding MD:  Cruzita Lederer  Time MD responded:  1353

## 2016-11-27 NOTE — Progress Notes (Addendum)
Notified by lab of multiple critical values, MD made aware via text page. New orders received for STAT redraw of CMP.

## 2016-11-28 ENCOUNTER — Inpatient Hospital Stay (HOSPITAL_COMMUNITY): Payer: Medicare Other

## 2016-11-28 ENCOUNTER — Encounter (HOSPITAL_COMMUNITY): Payer: Self-pay | Admitting: Gastroenterology

## 2016-11-28 LAB — COMPREHENSIVE METABOLIC PANEL
ALT: 39 U/L (ref 14–54)
ANION GAP: 8 (ref 5–15)
AST: 194 U/L — AB (ref 15–41)
Albumin: 1.1 g/dL — ABNORMAL LOW (ref 3.5–5.0)
Alkaline Phosphatase: 1223 U/L — ABNORMAL HIGH (ref 38–126)
BUN: 22 mg/dL — ABNORMAL HIGH (ref 6–20)
CHLORIDE: 104 mmol/L (ref 101–111)
CO2: 20 mmol/L — ABNORMAL LOW (ref 22–32)
Calcium: 7.2 mg/dL — ABNORMAL LOW (ref 8.9–10.3)
Creatinine, Ser: 1.14 mg/dL — ABNORMAL HIGH (ref 0.44–1.00)
GFR, EST AFRICAN AMERICAN: 49 mL/min — AB (ref >60)
GFR, EST NON AFRICAN AMERICAN: 42 mL/min — AB (ref >60)
Glucose, Bld: 97 mg/dL (ref 65–99)
POTASSIUM: 4.4 mmol/L (ref 3.5–5.1)
Sodium: 132 mmol/L — ABNORMAL LOW (ref 135–145)
Total Bilirubin: 2.3 mg/dL — ABNORMAL HIGH (ref 0.3–1.2)
Total Protein: 7.5 g/dL (ref 6.5–8.1)

## 2016-11-28 LAB — GLUCOSE, CAPILLARY
GLUCOSE-CAPILLARY: 92 mg/dL (ref 65–99)
GLUCOSE-CAPILLARY: 118 mg/dL — AB (ref 65–99)
GLUCOSE-CAPILLARY: 143 mg/dL — AB (ref 65–99)
GLUCOSE-CAPILLARY: 121 mg/dL — AB (ref 65–99)
Glucose-Capillary: 135 mg/dL — ABNORMAL HIGH (ref 65–99)

## 2016-11-28 LAB — CBC
HCT: 21.4 % — ABNORMAL LOW (ref 36.0–46.0)
Hemoglobin: 7.1 g/dL — ABNORMAL LOW (ref 12.0–15.0)
MCH: 28.7 pg (ref 26.0–34.0)
MCHC: 33.2 g/dL (ref 30.0–36.0)
MCV: 86.6 fL (ref 78.0–100.0)
PLATELETS: 237 10*3/uL (ref 150–400)
RBC: 2.47 MIL/uL — ABNORMAL LOW (ref 3.87–5.11)
RDW: 16.5 % — AB (ref 11.5–15.5)
WBC: 9.7 10*3/uL (ref 4.0–10.5)

## 2016-11-28 LAB — PREPARE RBC (CROSSMATCH)

## 2016-11-28 MED ORDER — LEVOTHYROXINE SODIUM 125 MCG PO TABS
125.0000 ug | ORAL_TABLET | Freq: Every day | ORAL | Status: DC
  Administered 2016-11-28 – 2016-11-30 (×3): 125 ug via ORAL
  Filled 2016-11-28 (×2): qty 1

## 2016-11-28 MED ORDER — SODIUM CHLORIDE 0.9 % IV SOLN: Freq: Once | INTRAVENOUS | Status: DC

## 2016-11-28 NOTE — Progress Notes (Signed)
PROGRESS NOTE  [name] [id number] DOB: January 19, 1928 DOA: 11/24/2016 PCP: Thressa Sheller, MD   LOS: 3 days   Brief Narrative: 83 y.o. female with medical history significant of hypogammaglobinemia, DM2, HTN, hypothyroidism, CKD stage III, elevated LFTs thought to be possibly secondary to malignancy of pancreas or biliary, asthma, was admitted to the hospital on 12/28 after presyncopal episode at home. In the ED she was found to have a low hemoglobin and to the threes. She's been complaining of melena and bright red blood per rectum intermittently over the last several days.  Assessment & Plan: Active Problems:   Hyponatremia   Symptomatic anemia   Leukocytosis   Acute renal failure (ARF) (HCC)   Hematemesis   Hyperglycemia   Shock (HCC)   Acute blood loss anemia   Esophageal varices (HCC)   Portal hypertensive gastropathy   Pancreatic mass   Acute blood loss anemia due to upper GI bleed with hemorrhagic shock - Patient's hemoglobin on presentation was 3.5, likely in the setting of GI bleed and blood pressure into the 70s with poor mentation. Critical care was initially consulted, have evaluated patient in the emergency room but patient recovered her blood pressure with fluids and blood products - Appreciate Dr. Ulyses Amor assistance, patient underwent an upper endoscopy 12/29 which showed esophageal varices (small, less than 5 mm). These were banded. It also showed portal hypertensive gastropathy and clotted blood in the gastric body. - She has no known history of liver cirrhosis, however given liver disease as below with biliary ductal dilatation and concern for cancer may be causing increased portal pressures causing these findings - stop octreotide, continue PPI - advance diet per GI - added propranolol to her regimen - Hemoglobin this morning 7.1, no active bleed but most likely recur liberating, we'll transfuse another unit of packed red blood cells today  Abdominal  pain - Patient complained of left abdominal pain, initially mentioned to the nurse this new however tells me he going on for about 2 weeks, her abdomen does look a little more distended, we will obtain an abdominal ultrasound to evaluate for possibility of worsening ascites. CT scan done 2 days ago showed "anasarca, mesenteric stranding and small amount of ascites compatible with third-spacing of fluid", as well as "wall thickening of the small and large bowel, likely secondary to the third spacing of fluid". She GI assistance with this matter as well  Suspicion for sepsis - On presentation patient has leukocytosis, hypothermia, she is hypotensive, however it does appear that these may be in fact related to #1 rather than real sepsis. CT abdomen and pelvis without any obvious source of infection, urinalysis is clear, chest x-ray is without any infiltrates - Cultures have remained negative, sepsis unlikely, discontinued all antibiotics and has remained stable since  Intra and extra - hepatic biliary ductal dilatation  - These are not new, there initially seen in August when she underwent a CT scan abdomen and pelvis - There has been concern as an outpatient over the last several months given progressive increase in her alkaline phosphatase and bilirubin levels, as well as weight loss that she has underlying malignancy, CA-19-9 was elevated to 309, suggesting possible pancreatic or biliary malignancy  - gastroenterology is following for #1, may need to have this further evaluated with EGD/EUS if family wishes to pursue that route, we'll defer this discussion to directly between family and Dr. Benson Norway - Alkaline phosphatase keeps trending up in the 1200 range today - Repeat abdominal ultrasound this  morning due to abdominal pain without any significant changes as compared to the CT scan  Hyponatremia  - Likely in the setting of dehydration, monitor sodium levels after fluids  Acute kidney injury  -  Patient with prior normal renal function, likely due to profound anemia as well as hypovolemia, continue IV fluids, keep hemoglobin greater than 7   Diabetes mellitus  - sliding scale   Hypertension - Resume home amlodipine given persistent hypertension, add propranolol to her regimen - Blood pressure much improved today into the [id number] systolic  Hypothyroidism - Continue Synthroid   DVT prophylaxis: SCDs Code Status: Partial Family Communication: No family at bedside Disposition Plan: transfer to telemetry   Consultants:   PCCM  GI  Procedures:   EGD Impression:                - Small (< 5 mm) esophageal varices. Completely eradicated. Banded. - Portal hypertensive gastropathy. - Clotted blood in the gastric body. - Duodenal erosions without bleeding. - No specimens collected.  Antimicrobials:  Ceftriaxone 12/29 >> 12/30  Vancomycin 12/29 >> 12/30  Subjective: - Complains of left sided abdominal pain,  Objective: Vitals:   11/27/16 2123 11/27/16 2231 11/28/16 0407 11/28/16 0411  BP: 127/69 138/67 140/67   Pulse: 69 66 70   Resp:  18 18   Temp:  98.7 F (37.1 C) 98.3 F (36.8 C)   TempSrc:  Oral    SpO2:  100% 96%   Weight:  66.3 kg (146 lb 2.6 oz)  64.4 kg (141 lb 15.6 oz)  Height:  4\' 11"  (1.499 m)      Intake/Output Summary (Last 24 hours) at 11/28/16 1106 Last data filed at 11/28/16 0843  Gross per 24 hour  Intake             1695 ml  Output              925 ml  Net              770 ml   Filed Weights   11/27/16 0324 11/27/16 2231 11/28/16 0411  Weight: 64.5 kg (142 lb 3.2 oz) 66.3 kg (146 lb 2.6 oz) 64.4 kg (141 lb 15.6 oz)   Examination: Constitutional: NAD, Alert, appropriate Vitals:   11/27/16 2123 11/27/16 2231 11/28/16 0407 11/28/16 0411  BP: 127/69 138/67 140/67   Pulse: 69 66 70   Resp:  18 18   Temp:  98.7 F (37.1 C) 98.3 F (36.8 C)   TempSrc:  Oral    SpO2:  100% 96%   Weight:  66.3 kg (146 lb 2.6 oz)  64.4 kg (141 lb  15.6 oz)  Height:  4\' 11"  (1.499 m)     Eyes: PERRL, lids and conjunctivae normal Respiratory: clear to auscultation bilaterally, no wheezing, no crackles.  Cardiovascular: Regular rate and rhythm, no murmurs / rubs / gallops. No LE edema.  Abdomen: minimal tenderness Left quadrants. Bowel sounds positive. Moderately distended Musculoskeletal: no clubbing / cyanosis. Skin: no rashes, lesions, ulcers. No induration Neurologic: non focal    Data Reviewed: I have personally reviewed following labs and imaging studies  CBC:  Recent Labs Lab 11/25/16 0840  11/26/16 0249 11/26/16 0848  11/26/16 1701 11/26/16 2044 11/27/16 0621 11/27/16 1332 11/27/16 2107 11/28/16 0602  WBC 15.6*  < > 13.2* 12.5*  --  10.8*  --  11.3*  --   --  9.7  NEUTROABS 12.5*  --   --   --   --   --   --   --   --   --   --  HGB 7.6*  < > 7.9* 8.2*  < > 7.6* 7.2* 8.3* 7.9* 7.2* 7.1*  HCT 22.4*  < > 22.6* 23.5*  < > 22.2* 21.0* 24.1* 23.5* 21.3* 21.4*  MCV 85.2  < > 81.3 83.0  --  81.9  --  83.7  --   --  86.6  PLT 189  < > 240 236  --  262  --  PLATELET CLUMPS NOTED ON SMEAR  --   --  237  < > = values in this interval not displayed. Basic Metabolic Panel:  Recent Labs Lab 11/25/16 0840 11/26/16 0249 11/27/16 1001 11/27/16 1416 11/28/16 0602  NA 133* 136 121* 130* 132*  K 4.1 3.6 2.6* 2.9* 4.4  CL 108 110 98* 103 104  CO2 19* 17* 17* 19* 20*  GLUCOSE 152* 112* 940* 179* 97  BUN 63* 51* 24* 28* 22*  CREATININE 1.51* 1.36* 1.08* 1.19* 1.14*  CALCIUM 7.3* 7.4* 6.1* 7.3* 7.2*  MG 1.7  --   --   --   --   PHOS 4.2  --   --   --   --    GFR: Estimated Creatinine Clearance: 28.9 mL/min (by C-G formula based on SCr of 1.14 mg/dL (H)). Liver Function Tests:  Recent Labs Lab 11/25/16 0840 11/26/16 0249 11/27/16 1001 11/27/16 1416 11/28/16 0602  AST 195* 188* 154* 192* 194*  ALT 37 39 31 39 39  ALKPHOS 617* 679* 880* 1,138* 1,223*  BILITOT 3.2* 2.7* 2.1* 2.5* 2.3*  PROT 7.9 8.2* 6.8 8.0 7.5   ALBUMIN 1.3* 1.2* <1.0* 1.1* 1.1*    Recent Labs Lab 11/24/16 2200  LIPASE 20    Recent Labs Lab 11/24/16 2208  AMMONIA 58*   Coagulation Profile:  Recent Labs Lab 11/24/16 2200 11/25/16 0840  INR 1.69 1.59   Cardiac Enzymes: No results for input(s): CKTOTAL, CKMB, CKMBINDEX, TROPONINI in the last 168 hours. BNP (last 3 results) No results for input(s): PROBNP in the last 8760 hours. HbA1C: No results for input(s): HGBA1C in the last 72 hours. CBG:  Recent Labs Lab 11/27/16 1654 11/27/16 1931 11/27/16 2352 11/28/16 0408 11/28/16 0754  GLUCAP 129* 137* 175* 92 118*   Lipid Profile: No results for input(s): CHOL, HDL, LDLCALC, TRIG, CHOLHDL, LDLDIRECT in the last 72 hours. Thyroid Function Tests: No results for input(s): TSH, T4TOTAL, FREET4, T3FREE, THYROIDAB in the last 72 hours. Anemia Panel: No results for input(s): VITAMINB12, FOLATE, FERRITIN, TIBC, IRON, RETICCTPCT in the last 72 hours. Urine analysis:    Component Value Date/Time   COLORURINE AMBER (A) 11/25/2016 0227   APPEARANCEUR CLEAR 11/25/2016 0227   LABSPEC 1.013 11/25/2016 0227   PHURINE 5.0 11/25/2016 0227   GLUCOSEU NEGATIVE 11/25/2016 0227   HGBUR MODERATE (A) 11/25/2016 0227   BILIRUBINUR NEGATIVE 11/25/2016 0227   KETONESUR NEGATIVE 11/25/2016 0227   PROTEINUR NEGATIVE 11/25/2016 0227   UROBILINOGEN 0.2 05/19/2008 0910   NITRITE NEGATIVE 11/25/2016 0227   LEUKOCYTESUR NEGATIVE 11/25/2016 0227   Sepsis Labs: Invalid input(s): PROCALCITONIN, LACTICIDVEN  Recent Results (from the past 240 hour(s))  Culture, blood (x 2)     Status: None (Preliminary result)   Collection Time: 11/25/16  8:54 AM  Result Value Ref Range Status   Specimen Description BLOOD RIGHT HAND  Final   Special Requests BOTTLES DRAWN AEROBIC AND ANAEROBIC 5CC  Final   Culture NO GROWTH 2 DAYS  Final   Report Status PENDING  Incomplete  Culture, blood (x 2)  Status: None (Preliminary result)   Collection  Time: 11/25/16  8:55 AM  Result Value Ref Range Status   Specimen Description BLOOD LEFT HAND  Final   Special Requests BOTTLES DRAWN AEROBIC AND ANAEROBIC 5CC  Final   Culture NO GROWTH 2 DAYS  Final   Report Status PENDING  Incomplete  MRSA PCR Screening     Status: None   Collection Time: 11/25/16  9:21 PM  Result Value Ref Range Status   MRSA by PCR NEGATIVE NEGATIVE Final    Comment:        The GeneXpert MRSA Assay (FDA approved for NASAL specimens only), is one component of a comprehensive MRSA colonization surveillance program. It is not intended to diagnose MRSA infection nor to guide or monitor treatment for MRSA infections.       Radiology Studies: US Abdomen Complete  Result Date: 11/28/2016 CLINICAL DATA:  Left abdominal pain for 2 weeks. Abdominal distension. History of a cholecystectomy. Biliary dilation on current CT scan. EXAM: ABDOMEN ULTRASOUND COMPLETE COMPARISON:  CT, 11/25/2016 FINDINGS: Gallbladder: Surgically absent Common bile duct: Diameter: Not definitively seen. There is intrahepatic bile duct dilation. Liver: Heterogeneous with a nodular contour. No mass or focal lesion. IVC: No abnormality visualized. Pancreas: 12 x 10 x 11 mm hypoechoic lesion in the pancreatic head corresponds to the hypoattenuating lesion noted on CT. No other pancreatic lesion. Spleen: Size and appearance within normal limits. Right Kidney: Length: 12.2 cm. Echogenicity within normal limits. No mass or hydronephrosis visualized. Left Kidney: Length: 11.9 cm. Echogenicity within normal limits. No mass or hydronephrosis visualized. Abdominal aorta: No aneurysm seen. Distal aorta obscured by bowel gas. Other findings: Trace amount of ascites adjacent to liver and spleen. Small right pleural effusion. IMPRESSION: 1. Findings are consistent with that seen on the current CT scan. 2. There is intrahepatic bile duct dilation. The common bile duct is not well visualized sonographically. 3. 12 mm  hypoattenuating lesion in the pancreas. This is stable from a CT dated 06/03/2016. This supports a benign etiology, specifically a small cyst. Intrahepatic bile duct dilation was similarly present on the prior CT, and may be from benign ducts scarring. 4. Cirrhosis.  No convincing liver mass. Electronically Signed   By: Lajean Manes M.D.   On: 11/28/2016 07:59     Scheduled Meds: . sodium chloride   Intravenous Once  . acetaminophen  650 mg Oral Once  . amLODipine  5 mg Oral Daily  . diphenhydrAMINE  25 mg Oral Once  . insulin aspart  0-9 Units Subcutaneous Q4H  . levothyroxine  125 mcg Oral QAC breakfast  . pantoprazole  40 mg Intravenous Q12H  . propranolol  20 mg Oral BID  . sodium chloride flush  3 mL Intravenous Q12H  . vancomycin  1,000 mg Intravenous Once   Continuous Infusions: . dextrose 5 % and 0.45% NaCl 50 mL (11/27/16 2008)    Marzetta Board, MD, PhD [facility] Pager [phone]  If 7PM-7AM, please contact night-coverage [email or web address] Password [email or web address] 11/28/2016, 11:06 AM

## 2016-11-28 NOTE — Evaluation (Signed)
Physical Therapy Evaluation Patient Details Name: [name] MRN: [id number] DOB: 1928-10-17 Today's Date: 11/28/2016   History of Present Illness  [name] is a 83 y.o. female with medical history significant of hypogammaglobinemia, DM2, HTN, hypothyroidism, CKD stage III, elevated LFTs thought to be possibly secondary to malignancy of pancreas or biliary, asthma, admitted after suffering a presyncopal episode on the toilet, falling back on the toilet hurting her left hip.  Clinical Impression  Pt admitted with/for presyncopal episode.  Pt presently at min assist level for OOB activity.  Pt currently limited functionally due to the problems listed. ( See problems list.)   Pt will benefit from PT to maximize function and safety in order to get ready for next venue listed below.     Follow Up Recommendations SNF;Other (comment) (if family can't immediately get help for her days )    Equipment Recommendations  Other (comment) (AD that may work for pt in her home.)    Recommendations for Other Services       Precautions / Restrictions Precautions Precautions: Fall      Mobility  Bed Mobility Overal bed mobility: Needs Assistance Bed Mobility: Supine to Sit     Supine to sit: Min assist     General bed mobility comments: no assist needed  Transfers Overall transfer level: Needs assistance   Transfers: Sit to/from Stand Sit to Stand: Min assist         General transfer comment: steady assist  Ambulation/Gait Ambulation/Gait assistance: Min assist;Min guard Ambulation Distance (Feet): 120 Feet Assistive device: Rolling walker (2 wheeled) Gait Pattern/deviations: Step-through pattern   Gait velocity interpretation: Below normal speed for age/gender General Gait Details: generally steady with RW after initial few feet.  Stairs            Wheelchair Mobility    Modified Rankin (Stroke Patients Only)       Balance Overall balance assessment:  Needs assistance Sitting-balance support: No upper extremity supported Sitting balance-Leahy Scale: Good     Standing balance support: Bilateral upper extremity supported Standing balance-Leahy Scale: Poor Standing balance comment: safest with AD                             Pertinent Vitals/Pain Pain Assessment: Faces Faces Pain Scale: Hurts even more Pain Location: L hip  Pain Descriptors / Indicators: Aching;Sore Pain Intervention(s): Monitored during session;Repositioned    Home Living Family/patient expects to be discharged to:: Private residence Living Arrangements: Other (Comment) (grandson) Available Help at Discharge: Family;Available PRN/intermittently (grandson works days) Type of Home: House Home Access: Stairs to enter Entrance Stairs-Rails: Right Entrance Stairs-Number of Steps: 5 Home Layout: One level;Laundry or work area in [place]: [name] - single point;Shower seat      Prior Function Level of Independence: Needs assistance   Gait / Transfers Assistance Needed: Furniture walker for household ambulation.  ADL's / Homemaking Assistance Needed: Reports cooking, cleaning. Reports falls.        Hand Dominance        Extremity/Trunk Assessment        Lower Extremity Assessment Lower Extremity Assessment: Overall WFL for tasks assessed (bil mild proximal weakness)       Communication   Communication: HOH  Cognition Arousal/Alertness: Awake/alert Behavior During Therapy: WFL for tasks assessed/performed Overall Cognitive Status: Within Functional Limits for tasks assessed  General Comments      Exercises     Assessment/Plan    PT Assessment Patient needs continued PT services  PT Problem List Decreased strength;Decreased activity tolerance;Decreased balance;Decreased mobility;Decreased knowledge of use of DME;Pain          PT Treatment Interventions DME instruction;Gait  training;Functional mobility training;Therapeutic activities;Balance training;Patient/family education    PT Goals (Current goals can be found in the Care Plan section)  Acute Rehab PT Goals Patient Stated Goal: I'd like to get back home PT Goal Formulation: With patient Time For Goal Achievement: 12/05/16 Potential to Achieve Goals: Good    Frequency Min 3X/week   Barriers to discharge Decreased caregiver support grandson works days and otherwise is gone Copywriter, advertising of Session   Activity Tolerance: Patient tolerated treatment well;Patient limited by fatigue Patient left: in chair;with call bell/phone within reach Nurse Communication: Mobility status         Time: 1702-1729 PT Time Calculation (min) (ACUTE ONLY): 27 min   Charges:   PT Evaluation $PT Eval Moderate Complexity: 1 Procedure PT Treatments $Gait Training: 8-22 mins   PT G CodesTessie Fass Galileo Cruz 11/28/2016, 6:48 PM 11/28/2016  [name], PT [phone]  (pager)

## 2016-11-28 NOTE — Progress Notes (Signed)
Paged Dr. Milinda Hirschfeld, pt had small, dark, marroon colored stool. Paged Dr. Larinda Buttery.

## 2016-11-29 LAB — CBC
HEMATOCRIT: 26.5 % — AB (ref 36.0–46.0)
Hemoglobin: 8.7 g/dL — ABNORMAL LOW (ref 12.0–15.0)
MCH: 28.8 pg (ref 26.0–34.0)
MCHC: 32.8 g/dL (ref 30.0–36.0)
MCV: 87.7 fL (ref 78.0–100.0)
Platelets: 236 10*3/uL (ref 150–400)
RBC: 3.02 MIL/uL — ABNORMAL LOW (ref 3.87–5.11)
RDW: 16.5 % — ABNORMAL HIGH (ref 11.5–15.5)
WBC: 7.9 10*3/uL (ref 4.0–10.5)

## 2016-11-29 LAB — COMPREHENSIVE METABOLIC PANEL
ALBUMIN: 1.2 g/dL — AB (ref 3.5–5.0)
ALK PHOS: 1066 U/L — AB (ref 38–126)
ALT: 34 U/L (ref 14–54)
AST: 162 U/L — AB (ref 15–41)
Anion gap: 3 — ABNORMAL LOW (ref 5–15)
BILIRUBIN TOTAL: 2.7 mg/dL — AB (ref 0.3–1.2)
BUN: 16 mg/dL (ref 6–20)
CALCIUM: 6.7 mg/dL — AB (ref 8.9–10.3)
CO2: 22 mmol/L (ref 22–32)
Chloride: 102 mmol/L (ref 101–111)
Creatinine, Ser: 0.98 mg/dL (ref 0.44–1.00)
GFR calc Af Amer: 59 mL/min — ABNORMAL LOW (ref >60)
GFR calc non Af Amer: 51 mL/min — ABNORMAL LOW (ref >60)
GLUCOSE: 432 mg/dL — AB (ref 65–99)
Potassium: 3.6 mmol/L (ref 3.5–5.1)
Sodium: 127 mmol/L — ABNORMAL LOW (ref 135–145)
TOTAL PROTEIN: 8.2 g/dL — AB (ref 6.5–8.1)

## 2016-11-29 LAB — GLUCOSE, CAPILLARY
GLUCOSE-CAPILLARY: 132 mg/dL — AB (ref 65–99)
GLUCOSE-CAPILLARY: 88 mg/dL (ref 65–99)
GLUCOSE-CAPILLARY: 210 mg/dL — AB (ref 65–99)
Glucose-Capillary: 106 mg/dL — ABNORMAL HIGH (ref 65–99)
Glucose-Capillary: 167 mg/dL — ABNORMAL HIGH (ref 65–99)
Glucose-Capillary: 12 mg/dL — CL (ref 65–99)
Glucose-Capillary: 134 mg/dL — ABNORMAL HIGH (ref 65–99)

## 2016-11-29 LAB — TYPE AND SCREEN
Blood Product Expiration Date: 201801032359
ISSUE DATE / TIME: 201801011339
Unit Type and Rh: 6200

## 2016-11-29 MED ORDER — SODIUM CHLORIDE 0.9 % IV SOLN
1.0000 g | Freq: Once | INTRAVENOUS | Status: AC
  Administered 2016-11-29: 1 g via INTRAVENOUS
  Filled 2016-11-29: qty 10

## 2016-11-29 NOTE — Progress Notes (Signed)
PROGRESS NOTE  [name] [id number] DOB: Sep 04, 1928 DOA: 11/24/2016 PCP: Thressa Sheller, MD   LOS: 4 days   Brief Narrative: 83 y.o. female with medical history significant of hypogammaglobinemia, DM2, HTN, hypothyroidism, CKD stage III, elevated LFTs thought to be possibly secondary to malignancy of pancreas or biliary, asthma, was admitted to the hospital on 12/28 after presyncopal episode at home. In the ED she was found to have a low hemoglobin and to the threes. She's been complaining of melena and bright red blood per rectum intermittently over the last several days.  Assessment & Plan: Active Problems:   Hyponatremia   Symptomatic anemia   Leukocytosis   Acute renal failure (ARF) (HCC)   Hematemesis   Hyperglycemia   Shock (HCC)   Acute blood loss anemia   Esophageal varices (HCC)   Portal hypertensive gastropathy   Pancreatic mass   Acute blood loss anemia due to upper GI bleed with hemorrhagic shock - Patient's hemoglobin on presentation was 3.5, likely in the setting of GI bleed and blood pressure into the 70s with poor mentation. Critical care was initially consulted, have evaluated patient in the emergency room but patient recovered her blood pressure with fluids and blood products. Gastroenterology was consulted, appreciate Dr. Ulyses Amor assistance, patient underwent an upper endoscopy 12/29 which showed esophageal varices (small, less than 5 mm). These were banded. It also showed portal hypertensive gastropathy and clotted blood in the gastric body.  She has no known history of liver cirrhosis, however given liver disease as below with biliary ductal dilatation and concern for cancer may be causing increased portal pressures causing these findings. Continue PPI. Added propranolol to her regimen  - hb now stable, no further bleeding, SNF placement pending per SW  Abdominal pain - Patient complained of left abdominal pain for about 2 weeks. CT scan done on admission  showed "anasarca, mesenteric stranding and small amount of ascites compatible with third-spacing of fluid", as well as "wall thickening of the small and large bowel, likely secondary to the third spacing of fluid". Abdominal US also with similar findings. Her pain is minimal, she has no nausea/vomiting and is tolerating a diet. Outpatient follow up with GI Suspicion for sepsis - On presentation patient has leukocytosis, hypothermia, she is hypotensive, however it does appear that these may be in fact related to #1 rather than real sepsis. CT abdomen and pelvis without any obvious source of infection, urinalysis is clear, chest x-ray is without any infiltrates. Cultures have remained negative, sepsis unlikely, discontinued all antibiotics and has remained stable since Intra and extra - hepatic biliary ductal dilatation - These are not new, there initially seen in August when she underwent a CT scan abdomen and pelvis. There has been concern as an outpatient over the last several months given progressive increase in her alkaline phosphatase and bilirubin levels, as well as weight loss that she has underlying malignancy, CA-19-9 was elevated to 309, suggesting possible pancreatic or biliary malignancy. Gastroenterology is following for #1, may need to have this further evaluated with EGD/EUS if family wishes to pursue that route, we'll defer this discussion to directly between family and Dr. Benson Norway as an outpatient. Alkaline phosphatase remains elevated in the 1000 range. Abdominal US stable. Hyponatremia - Likely in the setting of dehydration, stable, on 1/2 Na is down to 127 however corrects > 130 with hyperglycemia. Hyperglycemia on 1/2 BMP appears to be a lab error since CBGs are normal  Acute kidney injury - Patient with prior normal  renal function, likely due to profound anemia as well as hypovolemia, improved with fluids, now renal function back to normal, discontinue fluids Diabetes mellitus - sliding scale  here. Resume home medications on d/c Hypertension - Resumed home amlodipine given persistent hypertension, add propranolol to her regimen, stable now Hypothyroidism - Continue Synthroid   DVT prophylaxis: SCDs Code Status: Partial Family Communication: No family at bedside Disposition Plan: transfer to telemetry   Consultants:   PCCM  GI  Procedures:   EGD Impression:                - Small (< 5 mm) esophageal varices. Completely eradicated. Banded. - Portal hypertensive gastropathy. - Clotted blood in the gastric body. - Duodenal erosions without bleeding. - No specimens collected.  Antimicrobials:  Ceftriaxone 12/29 >> 12/30  Vancomycin 12/29 >> 12/30  Subjective: - no complaints this morning  Objective: Vitals:   11/28/16 1945 11/29/16 0423 11/29/16 0500 11/29/16 1100  BP: (!) 158/73 (!) 143/72  (!) 145/70  Pulse: 63 (!) 58  61  Resp: 18 16  16   Temp: 97.5 F (36.4 C) 97.9 F (36.6 C)  97.4 F (36.3 C)  TempSrc: Oral Oral  Oral  SpO2: 100% 99%  100%  Weight: 64.3 kg (141 lb 12.1 oz)  64.3 kg (141 lb 12.1 oz)   Height:        Intake/Output Summary (Last 24 hours) at 11/29/16 1256 Last data filed at 11/29/16 1200  Gross per 24 hour  Intake          1381.83 ml  Output             1003 ml  Net           378.83 ml   Filed Weights   11/28/16 0411 11/28/16 1945 11/29/16 0500  Weight: 64.4 kg (141 lb 15.6 oz) 64.3 kg (141 lb 12.1 oz) 64.3 kg (141 lb 12.1 oz)   Examination: Constitutional: NAD, Alert, appropriate Vitals:   11/28/16 1945 11/29/16 0423 11/29/16 0500 11/29/16 1100  BP: (!) 158/73 (!) 143/72  (!) 145/70  Pulse: 63 (!) 58  61  Resp: 18 16  16   Temp: 97.5 F (36.4 C) 97.9 F (36.6 C)  97.4 F (36.3 C)  TempSrc: Oral Oral  Oral  SpO2: 100% 99%  100%  Weight: 64.3 kg (141 lb 12.1 oz)  64.3 kg (141 lb 12.1 oz)   Height:       Eyes: PERRL, lids and conjunctivae normal Respiratory: clear to auscultation bilaterally, no wheezing, no  crackles.  Cardiovascular: Regular rate and rhythm, no murmurs / rubs / gallops. No LE edema.  Abdomen: minimal tenderness Left quadrants. Bowel sounds positive. Moderately distended Musculoskeletal: no clubbing / cyanosis.   Data Reviewed: I have personally reviewed following labs and imaging studies  CBC:  Recent Labs Lab 11/25/16 0840  11/26/16 0848  11/26/16 1701  11/27/16 0621 11/27/16 1332 11/27/16 2107 11/28/16 0602 11/29/16 0805  WBC 15.6*  < > 12.5*  --  10.8*  --  11.3*  --   --  9.7 7.9  NEUTROABS 12.5*  --   --   --   --   --   --   --   --   --   --   HGB 7.6*  < > 8.2*  < > 7.6*  < > 8.3* 7.9* 7.2* 7.1* 8.7*  HCT 22.4*  < > 23.5*  < > 22.2*  < > 24.1* 23.5* 21.3* 21.4*  26.5*  MCV 85.2  < > 83.0  --  81.9  --  83.7  --   --  86.6 87.7  PLT 189  < > 236  --  262  --  PLATELET CLUMPS NOTED ON SMEAR  --   --  237 236  < > = values in this interval not displayed. Basic Metabolic Panel:  Recent Labs Lab 11/25/16 0840 11/26/16 0249 11/27/16 1001 11/27/16 1416 11/28/16 0602 11/29/16 0805  NA 133* 136 121* 130* 132* 127*  K 4.1 3.6 2.6* 2.9* 4.4 3.6  CL 108 110 98* 103 104 102  CO2 19* 17* 17* 19* 20* 22  GLUCOSE 152* 112* 940* 179* 97 432*  BUN 63* 51* 24* 28* 22* 16  CREATININE 1.51* 1.36* 1.08* 1.19* 1.14* 0.98  CALCIUM 7.3* 7.4* 6.1* 7.3* 7.2* 6.7*  MG 1.7  --   --   --   --   --   PHOS 4.2  --   --   --   --   --    GFR: Estimated Creatinine Clearance: 33.6 mL/min (by C-G formula based on SCr of 0.98 mg/dL). Liver Function Tests:  Recent Labs Lab 11/26/16 0249 11/27/16 1001 11/27/16 1416 11/28/16 0602 11/29/16 0805  AST 188* 154* 192* 194* 162*  ALT 39 31 39 39 34  ALKPHOS 679* 880* 1,138* 1,223* 1,066*  BILITOT 2.7* 2.1* 2.5* 2.3* 2.7*  PROT 8.2* 6.8 8.0 7.5 8.2*  ALBUMIN 1.2* <1.0* 1.1* 1.1* 1.2*    Recent Labs Lab 11/24/16 2200  LIPASE 20    Recent Labs Lab 11/24/16 2208  AMMONIA 58*   Coagulation Profile:  Recent Labs Lab  11/24/16 2200 11/25/16 0840  INR 1.69 1.59   Cardiac Enzymes: No results for input(s): CKTOTAL, CKMB, CKMBINDEX, TROPONINI in the last 168 hours. BNP (last 3 results) No results for input(s): PROBNP in the last 8760 hours. HbA1C: No results for input(s): HGBA1C in the last 72 hours. CBG:  Recent Labs Lab 11/28/16 1943 11/29/16 0128 11/29/16 0422 11/29/16 0800 11/29/16 1142  GLUCAP 135* 132* 88 106* 167*   Lipid Profile: No results for input(s): CHOL, HDL, LDLCALC, TRIG, CHOLHDL, LDLDIRECT in the last 72 hours. Thyroid Function Tests: No results for input(s): TSH, T4TOTAL, FREET4, T3FREE, THYROIDAB in the last 72 hours. Anemia Panel: No results for input(s): VITAMINB12, FOLATE, FERRITIN, TIBC, IRON, RETICCTPCT in the last 72 hours. Urine analysis:    Component Value Date/Time   COLORURINE AMBER (A) 11/25/2016 0227   APPEARANCEUR CLEAR 11/25/2016 0227   LABSPEC 1.013 11/25/2016 0227   PHURINE 5.0 11/25/2016 0227   GLUCOSEU NEGATIVE 11/25/2016 0227   HGBUR MODERATE (A) 11/25/2016 0227   BILIRUBINUR NEGATIVE 11/25/2016 0227   KETONESUR NEGATIVE 11/25/2016 0227   PROTEINUR NEGATIVE 11/25/2016 0227   UROBILINOGEN 0.2 05/19/2008 0910   NITRITE NEGATIVE 11/25/2016 0227   LEUKOCYTESUR NEGATIVE 11/25/2016 0227   Sepsis Labs: Invalid input(s): PROCALCITONIN, LACTICIDVEN  Recent Results (from the past 240 hour(s))  Culture, blood (x 2)     Status: None (Preliminary result)   Collection Time: 11/25/16  8:54 AM  Result Value Ref Range Status   Specimen Description BLOOD RIGHT HAND  Final   Special Requests BOTTLES DRAWN AEROBIC AND ANAEROBIC 5CC  Final   Culture NO GROWTH 3 DAYS  Final   Report Status PENDING  Incomplete  Culture, blood (x 2)     Status: None (Preliminary result)   Collection Time: 11/25/16  8:55 AM  Result Value Ref Range  Status   Specimen Description BLOOD LEFT HAND  Final   Special Requests BOTTLES DRAWN AEROBIC AND ANAEROBIC 5CC  Final   Culture NO  GROWTH 3 DAYS  Final   Report Status PENDING  Incomplete  MRSA PCR Screening     Status: None   Collection Time: 11/25/16  9:21 PM  Result Value Ref Range Status   MRSA by PCR NEGATIVE NEGATIVE Final    Comment:        The GeneXpert MRSA Assay (FDA approved for NASAL specimens only), is one component of a comprehensive MRSA colonization surveillance program. It is not intended to diagnose MRSA infection nor to guide or monitor treatment for MRSA infections.       Radiology Studies: US Abdomen Complete  Result Date: 11/28/2016 CLINICAL DATA:  Left abdominal pain for 2 weeks. Abdominal distension. History of a cholecystectomy. Biliary dilation on current CT scan. EXAM: ABDOMEN ULTRASOUND COMPLETE COMPARISON:  CT, 11/25/2016 FINDINGS: Gallbladder: Surgically absent Common bile duct: Diameter: Not definitively seen. There is intrahepatic bile duct dilation. Liver: Heterogeneous with a nodular contour. No mass or focal lesion. IVC: No abnormality visualized. Pancreas: 12 x 10 x 11 mm hypoechoic lesion in the pancreatic head corresponds to the hypoattenuating lesion noted on CT. No other pancreatic lesion. Spleen: Size and appearance within normal limits. Right Kidney: Length: 12.2 cm. Echogenicity within normal limits. No mass or hydronephrosis visualized. Left Kidney: Length: 11.9 cm. Echogenicity within normal limits. No mass or hydronephrosis visualized. Abdominal aorta: No aneurysm seen. Distal aorta obscured by bowel gas. Other findings: Trace amount of ascites adjacent to liver and spleen. Small right pleural effusion. IMPRESSION: 1. Findings are consistent with that seen on the current CT scan. 2. There is intrahepatic bile duct dilation. The common bile duct is not well visualized sonographically. 3. 12 mm hypoattenuating lesion in the pancreas. This is stable from a CT dated 06/03/2016. This supports a benign etiology, specifically a small cyst. Intrahepatic bile duct dilation was similarly  present on the prior CT, and may be from benign ducts scarring. 4. Cirrhosis.  No convincing liver mass. Electronically Signed   By: Lajean Manes M.D.   On: 11/28/2016 07:59     Scheduled Meds: . sodium chloride   Intravenous Once  . acetaminophen  650 mg Oral Once  . amLODipine  5 mg Oral Daily  . calcium gluconate  1 g Intravenous Once  . diphenhydrAMINE  25 mg Oral Once  . insulin aspart  0-9 Units Subcutaneous Q4H  . levothyroxine  125 mcg Oral QAC breakfast  . pantoprazole  40 mg Intravenous Q12H  . propranolol  20 mg Oral BID  . sodium chloride flush  3 mL Intravenous Q12H  . vancomycin  1,000 mg Intravenous Once   Continuous Infusions: . dextrose 5 % and 0.45% NaCl 50 mL/hr at 11/28/16 2353    Marzetta Board, MD, PhD [facility] Pager [phone]  If 7PM-7AM, please contact night-coverage [email or web address] Password [facility] 11/29/2016, 12:56 PM

## 2016-11-29 NOTE — Care Management Important Message (Signed)
Important Message  Patient Details  Name: [name] MRN: [id number] Date of Birth: Feb 26, 1928   Medicare Important Message Given:  Yes    Tallen Schnorr [address] 11/29/2016, 4:39 PM

## 2016-11-30 DIAGNOSIS — I8500 Esophageal varices without bleeding: Secondary | ICD-10-CM

## 2016-11-30 LAB — GLUCOSE, CAPILLARY
GLUCOSE-CAPILLARY: 134 mg/dL — AB (ref 65–99)
GLUCOSE-CAPILLARY: 137 mg/dL — AB (ref 65–99)
GLUCOSE-CAPILLARY: 42 mg/dL — AB (ref 65–99)
GLUCOSE-CAPILLARY: 57 mg/dL — AB (ref 65–99)
GLUCOSE-CAPILLARY: 74 mg/dL (ref 65–99)
Glucose-Capillary: 76 mg/dL (ref 65–99)
Glucose-Capillary: 179 mg/dL — ABNORMAL HIGH (ref 65–99)
Glucose-Capillary: 158 mg/dL — ABNORMAL HIGH (ref 65–99)

## 2016-11-30 LAB — CULTURE, BLOOD (ROUTINE X 2)
CULTURE: NO GROWTH
CULTURE: NO GROWTH

## 2016-11-30 MED ORDER — PROPRANOLOL HCL 20 MG PO TABS: 20.0000 mg | ORAL_TABLET | Freq: Two times a day (BID) | ORAL | 0 refills | Status: DC

## 2016-11-30 MED ORDER — PANTOPRAZOLE SODIUM 40 MG PO TBEC: 40.0000 mg | DELAYED_RELEASE_TABLET | Freq: Two times a day (BID) | ORAL | 0 refills | Status: AC

## 2016-11-30 MED ORDER — AMLODIPINE BESYLATE 5 MG PO TABS: 5.0000 mg | ORAL_TABLET | Freq: Every day | ORAL | 0 refills | Status: DC

## 2016-11-30 NOTE — NC FL2 (Signed)
[place] LEVEL OF CARE SCREENING TOOL     IDENTIFICATION  Patient Name: [name] Birthdate: 08/27/1928 Sex: female Admission Date (Current Location): 11/24/2016  [facility] and [place] Number:  Herbalist and Address:  [facility], [place] [address], [place], [place] [address]      Provider Number: [id number]  Attending Physician Name and Address:  Velvet Bathe, MD  Relative Name and Phone Number:  Miles,Prince-Grandson;  [phone] (h), [phone] (mobile)    Current Level of Care: Hospital Recommended Level of Care: [place] Prior Approval Number:    Date Approved/Denied:   PASRR Number: [id number]  Discharge Plan: SNF    Current Diagnoses: Patient Active Problem List   Diagnosis Date Noted  . Pancreatic mass   . Shock ([employer]) 11/25/2016  . Acute blood loss anemia 11/25/2016  . Esophageal varices ([place]) 11/25/2016  . Portal hypertensive gastropathy 11/25/2016  . Symptomatic anemia 11/24/2016  . Leukocytosis 11/24/2016  . Acute renal failure (ARF) ([place]) 11/24/2016  . Hematemesis 11/24/2016  . Hyperglycemia 11/24/2016  . Sepsis ([place]) 04/23/2016  . UTI (lower urinary tract infection) 04/23/2016  . Hyponatremia 04/23/2016  . Hypokalemia 04/23/2016  . Hypocalcemia 04/23/2016  . Microcytic anemia 04/23/2016  . Trichomonas infection 04/23/2016    Orientation RESPIRATION BLADDER Height & Weight     Self, Time, Situation, Place  Normal Continent Weight: 141 lb 5 oz (64.1 kg) Height:  4\' 11"  (149.9 cm)  BEHAVIORAL SYMPTOMS/MOOD NEUROLOGICAL BOWEL NUTRITION STATUS      Continent Diet (DYS 1)  AMBULATORY STATUS COMMUNICATION OF NEEDS Skin   Limited Assist Verbally Normal                       Personal Care Assistance Level of Assistance  Bathing, Feeding, Dressing Bathing Assistance: Limited assistance Feeding assistance: Independent Dressing Assistance: Limited assistance      Functional Limitations Info  Sight, Hearing, Speech Sight Info: Adequate Hearing Info: Adequate Speech Info: Adequate    SPECIAL CARE FACTORS FREQUENCY  PT (By licensed PT)     PT Frequency: Evaluated 11/28/16 and a minimum of 3X per week therapy recommended              Contractures Contractures Info: Not present    Additional Factors Info  Code Status, Allergies, Insulin Sliding Scale Code Status Info: Partial Allergies Info: No known allergies   Insulin Sliding Scale Info: 0-9 Units every 4 hours        Current Medications (11/30/2016):  This is the current hospital active medication list Current Facility-Administered Medications  Medication Dose Route Frequency Provider Last Rate Last Dose  . 0.9 %  sodium chloride infusion   Intravenous Once Costin Karlyne Greenspan, MD      . acetaminophen (TYLENOL) tablet 650 mg  650 mg Oral Q6H PRN Toy Baker, MD       Or  . acetaminophen (TYLENOL) suppository 650 mg  650 mg Rectal Q6H PRN Toy Baker, MD      . acetaminophen (TYLENOL) tablet 650 mg  650 mg Oral Once Toy Baker, MD   Stopped at 11/25/16 [id number]  . amLODipine (NORVASC) tablet 5 mg  5 mg Oral Daily Costin Karlyne Greenspan, MD   5 mg at 11/30/16 1019  . diphenhydrAMINE (BENADRYL) capsule 25 mg  25 mg Oral Once Toy Baker, MD   Stopped at 11/25/16 [id number]  . hydrALAZINE (APRESOLINE) injection 2 mg  2 mg Intravenous Q4H PRN  Caren Griffins, MD      . insulin aspart (novoLOG) injection 0-9 Units  0-9 Units Subcutaneous Q4H Toy Baker, MD   2 Units at 11/30/16 0810  . levothyroxine (SYNTHROID, LEVOTHROID) tablet 125 mcg  125 mcg Oral QAC breakfast Caren Griffins, MD   125 mcg at 11/30/16 [id number]  . ondansetron (ZOFRAN) tablet 4 mg  4 mg Oral Q6H PRN Toy Baker, MD       Or  . ondansetron (ZOFRAN) injection 4 mg  4 mg Intravenous Q6H PRN Toy Baker, MD      . pantoprazole (PROTONIX) injection 40 mg  40 mg Intravenous Q12H Nelida Meuse  III, MD   40 mg at 11/30/16 1021  . propranolol (INDERAL) tablet 20 mg  20 mg Oral BID Caren Griffins, MD   20 mg at 11/30/16 1019  . sodium chloride flush (NS) 0.9 % injection 3 mL  3 mL Intravenous Q12H Toy Baker, MD   3 mL at 11/30/16 1021  . vancomycin (VANCOCIN) IVPB 1000 mg/200 mL premix  1,000 mg Intravenous Once Toy Baker, MD   Stopped at 11/25/16 0115     Discharge Medications: Please see discharge summary for a list of discharge medications.  Relevant Imaging Results:  Relevant Lab Results:   Additional Information [phone]  Sable Feil, LCSW

## 2016-11-30 NOTE — Progress Notes (Signed)
Hypoglycemic Event  CBG: 42 at 0445 and 57 at 0512  Treatment: 15 GM carbohydrate snack  Symptoms: None  Follow-up CBG: Time:0529 CBG Result:76  Possible Reasons for Event: Pt is diabetic and receive insulin  Comments/MD notified:Will continue to assess pt    [name]

## 2016-11-30 NOTE — Discharge Summary (Signed)
Physician Discharge Summary  [name] BAB:[id number] DOB: November 25, 1928 DOA: 11/24/2016  PCP: Thayer Headings, MD  Admit date: 11/24/2016 Discharge date: 11/30/2016  Time spent: > 35 minutes  Recommendations for Outpatient Follow-up:  1. Monitor hemoglobin levels 2. Ensure outpatient f/u with GI and Alk phosph evaluation   Discharge Diagnoses:  Active Problems:   Hyponatremia   Symptomatic anemia   Leukocytosis   Acute renal failure (ARF) (HCC)   Hematemesis   Hyperglycemia   Shock (HCC)   Acute blood loss anemia   Esophageal varices (HCC)   Portal hypertensive gastropathy   Pancreatic mass   Discharge Condition: stable  Diet recommendation: dysphagia 1 diet  Filed Weights   11/28/16 1945 11/29/16 0500 11/29/16 1945  Weight: 64.3 kg (141 lb 12.1 oz) 64.3 kg (141 lb 12.1 oz) 64.1 kg (141 lb 5 oz)    History of present illness:  83 y.o.femalewith medical history significant of hypogammaglobinemia, DM2, HTN, hypothyroidism, CKD stage III, elevated LFTs thought to be possibly secondary to malignancy of pancreas or biliary, asthma, was admitted to the hospital on 12/28 after presyncopal episode at home. In the ED she was found to have a low hemoglobin and to the threes. She's been complaining of melena and bright red blood per rectum intermittently over the last several days.  Hospital Course:  Acute blood loss anemia due to upper GI bleed with hemorrhagic shock - Patient's hemoglobin on presentation was 3.5, likely in the setting of GI bleed and blood pressure into the 70s with poor mentation. Critical care was initially consulted, have evaluated patient in the emergency room but patient recovered her blood pressure with fluids and blood products. Gastroenterology was consulted, appreciate Dr. Haywood Pao assistance, patient underwent an upper endoscopy 12/29 which showed esophageal varices (small, less than 5 mm). These were banded. It also showed portal hypertensive gastropathy  and clotted blood in the gastric body.  She has no known history of liver cirrhosis, however given liver disease as below with biliary ductal dilatation and concern for cancer may be causing increased portal pressures causing these findings. Continue PPI on d/c. Added propranolol to her regimen  - hb now stable, no further bleeding, SNF placement pending per SW  Abdominal pain - Patient complained of left abdominal pain for about 2 weeks. CT scan done on admission showed "anasarca, mesenteric stranding and small amount of ascites compatible with third-spacing of fluid", as well as "wall thickening of the small and large bowel, likely secondary to the third spacing of fluid". Abdominal US also with similar findings. Her pain is minimal, she has no nausea/vomiting and is tolerating a diet. Outpatient follow up with GI Suspicion for sepsis - On presentation patient has leukocytosis, hypothermia, she is hypotensive, however it does appear that these may be in fact related to #1 rather than real sepsis. CT abdomen and pelvis without any obvious source of infection, urinalysis is clear, chest x-ray is without any infiltrates. Cultures have remained negative, sepsis unlikely, discontinued all antibiotics and has remained stable since Intra and extra - hepatic biliary ductal dilatation - These are not new, there initially seen in August when she underwent a CT scan abdomen and pelvis. There has been concern as an outpatient over the last several months given progressive increase in her alkaline phosphatase and bilirubin levels, as well as weight loss that she has underlying malignancy, CA-19-9 was elevated to 309, suggesting possible pancreatic or biliary malignancy. Gastroenterology is following for #1, may need to have this further evaluated  with EGD/EUS if family wishes to pursue that route, we'll defer this discussion to directly between family and Dr. Benson Norway as an outpatient. Alkaline phosphatase remains  elevated in the 1000 range. Abdominal US stable. Hyponatremia - Likely in the setting of dehydration, stable, on 1/2 Na is down to 127 however corrects > 130 with hyperglycemia. Hyperglycemia on 1/2 BMP appears to be a lab error since CBGs are normal  Acute kidney injury - Patient with prior normal renal function, likely due to profound anemia as well as hypovolemia, improved with fluids, now renal function back to normal, discontinue fluids Diabetes mellitus -Resume home medications on d/c Hypertension - Resumed home amlodipine given persistent hypertension, add propranolol to her regimen, stable now Hypothyroidism - Continue Synthroid   Procedures: Cryptogenic cirrhosis / esophageal variceal bleed, s/p EGD with banding  Consultations:  GI: Dr Benson Norway  Discharge Exam: Vitals:   11/30/16 0525 11/30/16 1009  BP: (!) 155/77 (!) 154/85  Pulse:  68  Resp:  17  Temp:  97.6 F (36.4 C)    General: Pt in nad, alert and awake Cardiovascular: rrr, no rubs Respiratory: no increased wob, no wheezes  Discharge Instructions   Discharge Instructions    Call MD for:  severe uncontrolled pain    Complete by:  As directed    Call MD for:  temperature >100.4    Complete by:  As directed    Diet - low sodium heart healthy    Complete by:  As directed    Discharge instructions    Complete by:  As directed    Monitor hemoglobin levels.   Increase activity slowly    Complete by:  As directed      Current Discharge Medication List    START taking these medications   Details  amLODipine (NORVASC) 5 MG tablet Take 1 tablet (5 mg total) by mouth daily. Qty: 30 tablet, Refills: 0    pantoprazole (PROTONIX) 40 MG tablet Take 1 tablet (40 mg total) by mouth 2 (two) times daily. Qty: 60 tablet, Refills: 0    propranolol (INDERAL) 20 MG tablet Take 1 tablet (20 mg total) by mouth 2 (two) times daily. Qty: 60 tablet, Refills: 0      CONTINUE these medications which have NOT CHANGED    Details  glipiZIDE (GLUCOTROL XL) 5 MG 24 hr tablet Take 5 mg by mouth daily.    levothyroxine (SYNTHROID, LEVOTHROID) 125 MCG tablet Take 125 mcg by mouth daily.      STOP taking these medications     Amlodipine-Valsartan-HCTZ (EXFORGE HCT) 5-160-12.5 MG TABS      aspirin EC 81 MG tablet      metoprolol succinate (TOPROL-XL) 50 MG 24 hr tablet      ciprofloxacin (CIPRO) 250 MG tablet        No Known Allergies    The results of significant diagnostics from this hospitalization (including imaging, microbiology, ancillary and laboratory) are listed below for reference.    Significant Diagnostic Studies: US Abdomen Complete  Result Date: 11/28/2016 CLINICAL DATA:  Left abdominal pain for 2 weeks. Abdominal distension. History of a cholecystectomy. Biliary dilation on current CT scan. EXAM: ABDOMEN ULTRASOUND COMPLETE COMPARISON:  CT, 11/25/2016 FINDINGS: Gallbladder: Surgically absent Common bile duct: Diameter: Not definitively seen. There is intrahepatic bile duct dilation. Liver: Heterogeneous with a nodular contour. No mass or focal lesion. IVC: No abnormality visualized. Pancreas: 12 x 10 x 11 mm hypoechoic lesion in the pancreatic head corresponds to the hypoattenuating lesion  noted on CT. No other pancreatic lesion. Spleen: Size and appearance within normal limits. Right Kidney: Length: 12.2 cm. Echogenicity within normal limits. No mass or hydronephrosis visualized. Left Kidney: Length: 11.9 cm. Echogenicity within normal limits. No mass or hydronephrosis visualized. Abdominal aorta: No aneurysm seen. Distal aorta obscured by bowel gas. Other findings: Trace amount of ascites adjacent to liver and spleen. Small right pleural effusion. IMPRESSION: 1. Findings are consistent with that seen on the current CT scan. 2. There is intrahepatic bile duct dilation. The common bile duct is not well visualized sonographically. 3. 12 mm hypoattenuating lesion in the pancreas. This is stable from a  CT dated 06/03/2016. This supports a benign etiology, specifically a small cyst. Intrahepatic bile duct dilation was similarly present on the prior CT, and may be from benign ducts scarring. 4. Cirrhosis.  No convincing liver mass. Electronically Signed   By: Lajean Manes M.D.   On: 11/28/2016 07:59   Ct Abdomen Pelvis W Contrast  Result Date: 11/25/2016 CLINICAL DATA:  Patient with diffuse abdominal pain and bloody stools. EXAM: CT ABDOMEN AND PELVIS WITH CONTRAST TECHNIQUE: Multidetector CT imaging of the abdomen and pelvis was performed using the standard protocol following bolus administration of intravenous contrast. CONTRAST:  40m ISOVUE-300 IOPAMIDOL (ISOVUE-300) INJECTION 61% COMPARISON:  CT abdomen pelvis 06/03/2016 FINDINGS: Lower chest: Normal heart size. Grossly unchanged pericardial phrenic lymph nodes measuring up to 12 mm (image 9; series 201). Small layering right pleural effusion. Interstitial and ground-glass opacities within the lung bases bilaterally. Hepatobiliary: Liver is nodular in contour. Caudate lobe hypertrophy. Re- demonstrated extensive intrahepatic biliary ductal dilatation. No definite mass is identified. Status post cholecystectomy. Pancreas: Unchanged 10 x 8 mm cystic lesion within the pancreatic head and adjacent 15 x 10 mm cystic lesion within the pancreatic head. No pancreatic ductal dilatation. Spleen: Unremarkable Adrenals/Urinary Tract: The adrenal glands are normal. Kidneys enhance symmetrically with contrast. No hydronephrosis. Urinary bladder is unremarkable. Stomach/Bowel: Circumferential wall thickening of the colon. There is suggestion of associated wall thickening of the small bowel. No free intraperitoneal air. Vascular/Lymphatic: Normal caliber abdominal aorta. Peripheral calcified atherosclerotic plaque. Persistent enlarged retroperitoneal lymph nodes measuring up to 12 mm within the left periaortic location (image 34; series 201). Reproductive: Uterus is  unremarkable. Adnexal structures are unremarkable. Other: Small volume ascites. Musculoskeletal: Anasarca. Lower thoracic and lumbar spine degenerative changes. IMPRESSION: Re- demonstrated extensive intrahepatic biliary ductal dilatation which may be secondary to benign or malignant stricture. Interval development of anasarca, mesenteric stranding and small amount of ascites compatible with third-spacing of fluid. There is associated wall thickening of the small and large bowel, likely secondary to the third spacing of fluid. Superimposed enteritis is not excluded. Aortic atherosclerosis. Unchanged enlarged pericardiophrenic and retroperitoneal lymph nodes. These were previously evaluated on PET-CT 06/30/2016. Grossly unchanged cystic lesions within the pancreatic head. Electronically Signed   By: DLovey NewcomerM.D.   On: 11/25/2016 11:40   Dg Chest Port 1 View  Result Date: 11/25/2016 CLINICAL DATA:  Sepsis.  Abdominal pain. EXAM: PORTABLE CHEST 1 VIEW COMPARISON:  04/23/2016 FINDINGS: Unchanged heart size and mediastinal contours allowing for differences in technique. Mild vascular congestion is new. No confluent airspace disease. No evidence of pleural effusion or pneumothorax. Surgical clips about the thoracic inlet. Unchanged osseous structures. IMPRESSION: Mild vascular congestion, new. Otherwise unchanged exam. No evidence of pneumonia. Electronically Signed   By: MJeb LeveringM.D.   On: 11/25/2016 01:35   Dg Abd Portable 1v  Result Date: 11/25/2016 CLINICAL  DATA:  Abdominal pain. Epigastric pain for 3 months. Nausea and vomiting. EXAM: PORTABLE ABDOMEN - 1 VIEW COMPARISON:  Visualized abdomen on lumbar spine radiographs 10/26/2016. CT 06/03/2016 FINDINGS: No evidence of bowel obstruction. Similar bowel gas pattern to prior exam with air scattered throughout stomach, large and small bowel. No small bowel dilatation. No evidence of free air. Clips in the right upper quadrant postcholecystectomy.  Scattered vascular calcifications. No acute osseous abnormalities. IMPRESSION: Nonobstructive bowel gas pattern. No acute abnormality demonstrated radiographically. Electronically Signed   By: Jeb Levering M.D.   On: 11/25/2016 01:34    Microbiology: Recent Results (from the past 240 hour(s))  Culture, blood (x 2)     Status: None   Collection Time: 11/25/16  8:54 AM  Result Value Ref Range Status   Specimen Description BLOOD RIGHT HAND  Final   Special Requests BOTTLES DRAWN AEROBIC AND ANAEROBIC 5CC  Final   Culture NO GROWTH 5 DAYS  Final   Report Status 11/30/2016 FINAL  Final  Culture, blood (x 2)     Status: None   Collection Time: 11/25/16  8:55 AM  Result Value Ref Range Status   Specimen Description BLOOD LEFT HAND  Final   Special Requests BOTTLES DRAWN AEROBIC AND ANAEROBIC 5CC  Final   Culture NO GROWTH 5 DAYS  Final   Report Status 11/30/2016 FINAL  Final  MRSA PCR Screening     Status: None   Collection Time: 11/25/16  9:21 PM  Result Value Ref Range Status   MRSA by PCR NEGATIVE NEGATIVE Final    Comment:        The GeneXpert MRSA Assay (FDA approved for NASAL specimens only), is one component of a comprehensive MRSA colonization surveillance program. It is not intended to diagnose MRSA infection nor to guide or monitor treatment for MRSA infections.      Labs: Basic Metabolic Panel:  Recent Labs Lab 11/25/16 0840 11/26/16 0249 11/27/16 1001 11/27/16 1416 11/28/16 0602 11/29/16 0805  NA 133* 136 121* 130* 132* 127*  K 4.1 3.6 2.6* 2.9* 4.4 3.6  CL 108 110 98* 103 104 102  CO2 19* 17* 17* 19* 20* 22  GLUCOSE 152* 112* 940* 179* 97 432*  BUN 63* 51* 24* 28* 22* 16  CREATININE 1.51* 1.36* 1.08* 1.19* 1.14* 0.98  CALCIUM 7.3* 7.4* 6.1* 7.3* 7.2* 6.7*  MG 1.7  --   --   --   --   --   PHOS 4.2  --   --   --   --   --    Liver Function Tests:  Recent Labs Lab 11/26/16 0249 11/27/16 1001 11/27/16 1416 11/28/16 0602 11/29/16 0805  AST 188*  154* 192* 194* 162*  ALT 39 31 39 39 34  ALKPHOS 679* 880* 1,138* 1,223* 1,066*  BILITOT 2.7* 2.1* 2.5* 2.3* 2.7*  PROT 8.2* 6.8 8.0 7.5 8.2*  ALBUMIN 1.2* <1.0* 1.1* 1.1* 1.2*    Recent Labs Lab 11/24/16 2200  LIPASE 20    Recent Labs Lab 11/24/16 2208  AMMONIA 58*   CBC:  Recent Labs Lab 11/25/16 0840  11/26/16 0848  11/26/16 1701  11/27/16 0621 11/27/16 1332 11/27/16 2107 11/28/16 0602 11/29/16 0805  WBC 15.6*  < > 12.5*  --  10.8*  --  11.3*  --   --  9.7 7.9  NEUTROABS 12.5*  --   --   --   --   --   --   --   --   --   --  HGB 7.6*  < > 8.2*  < > 7.6*  < > 8.3* 7.9* 7.2* 7.1* 8.7*  HCT 22.4*  < > 23.5*  < > 22.2*  < > 24.1* 23.5* 21.3* 21.4* 26.5*  MCV 85.2  < > 83.0  --  81.9  --  83.7  --   --  86.6 87.7  PLT 189  < > 236  --  262  --  PLATELET CLUMPS NOTED ON SMEAR  --   --  237 236  < > = values in this interval not displayed. Cardiac Enzymes: No results for input(s): CKTOTAL, CKMB, CKMBINDEX, TROPONINI in the last 168 hours. BNP: BNP (last 3 results) No results for input(s): BNP in the last 8760 hours.  ProBNP (last 3 results) No results for input(s): PROBNP in the last 8760 hours.  CBG:  Recent Labs Lab 11/30/16 0512 11/30/16 0529 11/30/16 0622 11/30/16 0807 11/30/16 1134  GLUCAP 57* 76 134* 179* 158*    Signed:  Velvet Bathe MD.  [facility] 11/30/2016, 3:28 PM

## 2016-11-30 NOTE — Progress Notes (Signed)
Physical Therapy Treatment Patient Details Name: AVALEY STAHMER MRN: [id number] DOB: 18-Oct-1928 Today's Date: 11/30/2016    History of Present Illness VERNALEE SCHULTS is a 83 y.o. female with medical history significant of hypogammaglobinemia, DM2, HTN, hypothyroidism, CKD stage III, elevated LFTs thought to be possibly secondary to malignancy of pancreas or biliary, asthma, admitted after suffering a presyncopal episode on the toilet, falling back on the toilet hurting her left hip.    PT Comments    Progressing steadily.  Emphasized  Exercise in standing and gait stability and tolerance.  Follow Up Recommendations  SNF     Equipment Recommendations  Other (comment)    Recommendations for Other Services       Precautions / Restrictions Precautions Precautions: Fall    Mobility  Bed Mobility               General bed mobility comments: oob already  Transfers Overall transfer level: Needs assistance   Transfers: Sit to/from Stand Sit to Stand: Min guard         General transfer comment: guard for safety  Ambulation/Gait Ambulation/Gait assistance: Min guard Ambulation Distance (Feet): 250 Feet Assistive device: Rolling walker (2 wheeled) Gait Pattern/deviations: Step-through pattern     General Gait Details: generally steady and slow.   Stairs            Wheelchair Mobility    Modified Rankin (Stroke Patients Only)       Balance Overall balance assessment: Needs assistance Sitting-balance support: No upper extremity supported Sitting balance-Leahy Scale: Good     Standing balance support: Single extremity supported;Bilateral upper extremity supported Standing balance-Leahy Scale: Fair Standing balance comment: static standing without assist                    Cognition Arousal/Alertness: Awake/alert Behavior During Therapy: WFL for tasks assessed/performed Overall Cognitive Status: Within Functional Limits for tasks  assessed                      Exercises General Exercises - Lower Extremity Hip ABduction/ADduction: AROM;Strengthening;Both;10 reps;Standing Hip Flexion/Marching: AROM;Strengthening;Both;10 reps;Standing Toe Raises: AROM;Both;15 reps;Standing Heel Raises: AROM;Both;15 reps;Standing Mini-Sqauts: AROM;10 reps;Standing Other Exercises Other Exercises: modified push ups x10 off the countertop    General Comments        Pertinent Vitals/Pain Pain Assessment: No/denies pain    Home Living                      Prior Function            PT Goals (current goals can now be found in the care plan section) Acute Rehab PT Goals Patient Stated Goal: I'd like to get back home PT Goal Formulation: With patient Time For Goal Achievement: 12/05/16 Potential to Achieve Goals: Good Progress towards PT goals: Progressing toward goals    Frequency    Min 3X/week      PT Plan Current plan remains appropriate    Co-evaluation             End of Session     Patient left: in chair;with call bell/phone within reach     Time: 1414-1445 PT Time Calculation (min) (ACUTE ONLY): 31 min  Charges:  $Gait Training: 8-22 mins $Therapeutic Exercise: 8-22 mins                    G Codes:      Tessie Fass Tenley Winward 11/30/2016, 3:32 PM  11/30/2016  Donnella Sham, [place] [phone]  (pager)

## 2016-11-30 NOTE — Clinical Social Work Placement (Signed)
   CLINICAL SOCIAL WORK PLACEMENT  NOTE 11/30/16 - DISCHARGED TO [facility] VIA AMBULANCE  Date:  11/30/2016  Patient Details  Name: [name] MRN: [id number] Date of Birth: 06/02/1928  Clinical Social Work is seeking post-discharge placement for this patient at the [place] level of care (*CSW will initial, date and re-position this form in  chart as items are completed):  No (Patient had facility preference)   Patient/family provided with [place] Work Department's list of facilities offering this level of care within the geographic area requested by the patient (or if unable, by the patient's family).  Yes   Patient/family informed of their freedom to choose among providers that offer the needed level of care, that participate in Medicare, Medicaid or managed care program needed by the patient, have an available bed and are willing to accept the patient.  No   Patient/family informed of [facility]'s ownership interest in [facility] and [facility], as well as of the fact that they are under no obligation to receive care at these facilities.  PASRR submitted to EDS on 11/30/16     PASRR number received on 11/30/16     Existing PASRR number confirmed on       FL2 transmitted to all facilities in geographic area requested by pt/family on 11/30/16     FL2 transmitted to all facilities within larger geographic area on       Patient informed that his/her managed care company has contracts with or will negotiate with certain facilities, including the following:        Yes   Patient/family informed of bed offers received.  Patient chooses bed at [facility]     Physician recommends and patient chooses bed at      Patient to be transferred to [facility] on 11/30/16.  Patient to be transferred to facility by Ambulance     Patient family notified on 11/30/16 of transfer.  Name of family member notified:   Claude Manges, grandson by phone [phone])     PHYSICIAN       Additional Comment:    _______________________________________________ Sable Feil, LCSW 11/30/2016, 7:02 PM

## 2016-11-30 NOTE — Clinical Social Work Note (Signed)
Clinical Social Work Assessment  Patient Details  Name: [name] MRN: [id number] Date of Birth: 09/11/1928  Date of referral:  11/30/16               Reason for consult:  Facility Placement                Permission sought to share information with:  Family Supports Permission granted to share information::  Yes, Verbal Permission Granted  Name::     [place]::     Relationship::  Ship broker Information:  [phone] (mobile)  Housing/Transportation Living arrangements for the past 2 months:  [place] of Information:  Patient Patient Interpreter Needed:  None Criminal Activity/Legal Involvement Pertinent to Current Situation/Hospitalization:  No - Comment as needed Significant Relationships:  Other Family Members Lives with:  Other (Comment) (Grandson Holly Bodily) Do you feel safe going back to the place where you live?  No Need for family participation in patient care:  Yes (Comment)  Care giving concerns:  Patient agreeable to ST rehab before returning home as her grandson works.  Social Worker assessment / plan:  CSW talked with the patient at bedside regarding discharge disposition and recommendation of ST rehab. Patient agreeable and reported that her grandson had checked on [facility] and she will go to this facility.  Employment status:  Retired Science writer) PT Recommendations:  [place] / Referral to community resources:  Other (Comment Required) (Patient provided CSW with facility preference)  Patient/Family's Response to care:  No concerns expressed regarding care during hospitalization.  Patient/Family's Understanding of and Emotional Response to Diagnosis, Current Treatment, and Prognosis:  Not discussed.  Emotional Assessment Appearance:  Appears stated age Attitude/Demeanor/Rapport:  Other (Appropriate) Affect (typically observed):   Appropriate, Pleasant Orientation:  Oriented to Self, Oriented to Place, Oriented to  Time, Oriented to Situation Alcohol / Substance use:  Never Used Psych involvement (Current and /or in the community):  No (Comment)  Discharge Needs  Concerns to be addressed:  Discharge Planning Concerns Readmission within the last 30 days:  No Current discharge risk:  None Barriers to Discharge:  No Barriers Identified   Sable Feil, LCSW 11/30/2016, 6:56 PM

## 2017-02-14 ENCOUNTER — Emergency Department (HOSPITAL_COMMUNITY): Payer: Medicare Other

## 2017-02-14 ENCOUNTER — Inpatient Hospital Stay (HOSPITAL_COMMUNITY)
Admission: EM | Admit: 2017-02-14 | Discharge: 2017-02-16 | DRG: 641 | Disposition: A | Discharge status: Home / Self Care | Payer: Medicare Other | Attending: Family Medicine | Admitting: Family Medicine

## 2017-02-14 ENCOUNTER — Encounter (HOSPITAL_COMMUNITY): Payer: Self-pay | Admitting: Emergency Medicine

## 2017-02-14 DIAGNOSIS — R531 Weakness: Secondary | ICD-10-CM | POA: Diagnosis not present

## 2017-02-14 DIAGNOSIS — D649 Anemia, unspecified: Secondary | ICD-10-CM

## 2017-02-14 DIAGNOSIS — J45909 Unspecified asthma, uncomplicated: Secondary | ICD-10-CM | POA: Diagnosis present

## 2017-02-14 DIAGNOSIS — Z833 Family history of diabetes mellitus: Secondary | ICD-10-CM

## 2017-02-14 DIAGNOSIS — Z7984 Long term (current) use of oral hypoglycemic drugs: Secondary | ICD-10-CM

## 2017-02-14 DIAGNOSIS — I8500 Esophageal varices without bleeding: Secondary | ICD-10-CM | POA: Diagnosis present

## 2017-02-14 DIAGNOSIS — Z66 Do not resuscitate: Secondary | ICD-10-CM | POA: Diagnosis present

## 2017-02-14 DIAGNOSIS — E871 Hypo-osmolality and hyponatremia: Principal | ICD-10-CM | POA: Diagnosis present

## 2017-02-14 DIAGNOSIS — D509 Iron deficiency anemia, unspecified: Secondary | ICD-10-CM | POA: Diagnosis present

## 2017-02-14 DIAGNOSIS — R0602 Shortness of breath: Secondary | ICD-10-CM

## 2017-02-14 DIAGNOSIS — R7401 Elevation of levels of liver transaminase levels: Secondary | ICD-10-CM

## 2017-02-14 DIAGNOSIS — Z79899 Other long term (current) drug therapy: Secondary | ICD-10-CM

## 2017-02-14 DIAGNOSIS — K3189 Other diseases of stomach and duodenum: Secondary | ICD-10-CM | POA: Diagnosis present

## 2017-02-14 DIAGNOSIS — R160 Hepatomegaly, not elsewhere classified: Secondary | ICD-10-CM | POA: Diagnosis present

## 2017-02-14 DIAGNOSIS — Z8249 Family history of ischemic heart disease and other diseases of the circulatory system: Secondary | ICD-10-CM

## 2017-02-14 DIAGNOSIS — E079 Disorder of thyroid, unspecified: Secondary | ICD-10-CM | POA: Diagnosis present

## 2017-02-14 DIAGNOSIS — K869 Disease of pancreas, unspecified: Secondary | ICD-10-CM | POA: Diagnosis present

## 2017-02-14 DIAGNOSIS — Z515 Encounter for palliative care: Secondary | ICD-10-CM | POA: Diagnosis present

## 2017-02-14 DIAGNOSIS — E119 Type 2 diabetes mellitus without complications: Secondary | ICD-10-CM | POA: Diagnosis present

## 2017-02-14 DIAGNOSIS — R627 Adult failure to thrive: Secondary | ICD-10-CM | POA: Diagnosis present

## 2017-02-14 DIAGNOSIS — E860 Dehydration: Secondary | ICD-10-CM | POA: Diagnosis present

## 2017-02-14 DIAGNOSIS — E46 Unspecified protein-calorie malnutrition: Secondary | ICD-10-CM

## 2017-02-14 DIAGNOSIS — Z6825 Body mass index (BMI) 25.0-25.9, adult: Secondary | ICD-10-CM

## 2017-02-14 DIAGNOSIS — C229 Malignant neoplasm of liver, not specified as primary or secondary: Secondary | ICD-10-CM | POA: Diagnosis present

## 2017-02-14 DIAGNOSIS — Z7189 Other specified counseling: Secondary | ICD-10-CM

## 2017-02-14 DIAGNOSIS — R740 Nonspecific elevation of levels of transaminase and lactic acid dehydrogenase [LDH]: Secondary | ICD-10-CM | POA: Diagnosis present

## 2017-02-14 DIAGNOSIS — K766 Portal hypertension: Secondary | ICD-10-CM | POA: Diagnosis present

## 2017-02-14 DIAGNOSIS — R17 Unspecified jaundice: Secondary | ICD-10-CM

## 2017-02-14 DIAGNOSIS — E039 Hypothyroidism, unspecified: Secondary | ICD-10-CM | POA: Diagnosis present

## 2017-02-14 DIAGNOSIS — I10 Essential (primary) hypertension: Secondary | ICD-10-CM | POA: Diagnosis present

## 2017-02-14 HISTORY — DX: Other specified diseases of pancreas: K86.89

## 2017-02-14 LAB — COMPREHENSIVE METABOLIC PANEL
ALBUMIN: 1.3 g/dL — AB (ref 3.5–5.0)
ALK PHOS: 928 U/L — AB (ref 38–126)
ALT: 26 U/L (ref 14–54)
AST: 155 U/L — ABNORMAL HIGH (ref 15–41)
Anion gap: 10 (ref 5–15)
BUN: 29 mg/dL — ABNORMAL HIGH (ref 6–20)
CALCIUM: 7.7 mg/dL — AB (ref 8.9–10.3)
CO2: 20 mmol/L — ABNORMAL LOW (ref 22–32)
CREATININE: 1.12 mg/dL — AB (ref 0.44–1.00)
Chloride: 95 mmol/L — ABNORMAL LOW (ref 101–111)
GFR calc non Af Amer: 43 mL/min — ABNORMAL LOW (ref >60)
GFR, EST AFRICAN AMERICAN: 50 mL/min — AB (ref >60)
GLUCOSE: 141 mg/dL — AB (ref 65–99)
Potassium: 3.2 mmol/L — ABNORMAL LOW (ref 3.5–5.1)
SODIUM: 125 mmol/L — AB (ref 135–145)
Total Bilirubin: 4.5 mg/dL — ABNORMAL HIGH (ref 0.3–1.2)
Total Protein: 9.8 g/dL — ABNORMAL HIGH (ref 6.5–8.1)

## 2017-02-14 LAB — CBC
HCT: 23.8 % — ABNORMAL LOW (ref 36.0–46.0)
Hemoglobin: 8 g/dL — ABNORMAL LOW (ref 12.0–15.0)
MCH: 25.7 pg — ABNORMAL LOW (ref 26.0–34.0)
MCHC: 33.6 g/dL (ref 30.0–36.0)
MCV: 76.5 fL — ABNORMAL LOW (ref 78.0–100.0)
PLATELETS: 449 10*3/uL — AB (ref 150–400)
RBC: 3.11 MIL/uL — ABNORMAL LOW (ref 3.87–5.11)
RDW: 14.6 % (ref 11.5–15.5)
WBC: 12.5 10*3/uL — ABNORMAL HIGH (ref 4.0–10.5)

## 2017-02-14 LAB — I-STAT CG4 LACTIC ACID, ED: Lactic Acid, Venous: 2.61 mmol/L (ref 0.5–1.9)

## 2017-02-14 LAB — TYPE AND SCREEN
ABO/RH(D): A POS
Antibody Screen: NEGATIVE

## 2017-02-14 LAB — BRAIN NATRIURETIC PEPTIDE: B Natriuretic Peptide: 247.2 pg/mL — ABNORMAL HIGH (ref 0.0–100.0)

## 2017-02-14 NOTE — ED Notes (Signed)
Pt transported to xray 

## 2017-02-14 NOTE — ED Triage Notes (Signed)
Pt here for low hgb; pt sts hx of same and was 7 today; pt sts generalized weakness

## 2017-02-14 NOTE — ED Provider Notes (Signed)
[facility] DEPT Provider Note   CSN: [id number] Arrival date & time: 02/14/17  1649     History   Chief Complaint Chief Complaint  Patient presents with  . Abnormal Lab    HPI [name] is a 83 y.o. female  With a complicated hx who presents with cc of sob, fatigue and lethargy. She is attended by her family member. Patient has known  Pancreatic mass and has most recently been admitted for acute GI bleed secondary to esophageal varices that are a result of her portal hypertension. The patient has been feeling more short of breath and fatigued. She called ems last Thursday 02/09/2017 and was assessed by EMS for her dyspnea.[facility] recommended that she follow up with her PCP on Thursday or Friday. Patient states that she did and was called today because her hemoglobin was low and she was sent to the emergency department. Her family member says that she is also been more lethargic, but has been mentating appropriately.  She denies fevers or chills. He has noticed that she has had increased yellowing of her skin and eyes every time. They also acknowledge weight loss.  The patient denies a history of CHF. However, she does have chronic lower leg edema and just recently began wearing compression stockings, which she states have made a significant provement in her legs. HPI  Past Medical History:  Diagnosis Date  . Asthma   . Diabetes mellitus without complication ([place])   . Hypertension   . Thyroid disease     Patient Active Problem List   Diagnosis Date Noted  . Pancreatic mass   . Shock ([place]) 11/25/2016  . Acute blood loss anemia 11/25/2016  . Esophageal varices ([place]) 11/25/2016  . Portal hypertensive gastropathy 11/25/2016  . Symptomatic anemia 11/24/2016  . Leukocytosis 11/24/2016  . Acute renal failure (ARF) ([place]) 11/24/2016  . Hematemesis 11/24/2016  . Hyperglycemia 11/24/2016  . Sepsis ([place]) 04/23/2016  . UTI (lower urinary tract infection) 04/23/2016  .  Hyponatremia 04/23/2016  . Hypokalemia 04/23/2016  . Hypocalcemia 04/23/2016  . Microcytic anemia 04/23/2016  . Trichomonas infection 04/23/2016    Past Surgical History:  Procedure Laterality Date  . APPENDECTOMY    . CHOLECYSTECTOMY    . ESOPHAGOGASTRODUODENOSCOPY N/A 11/25/2016   Procedure: ESOPHAGOGASTRODUODENOSCOPY (EGD);  Surgeon: Carol Ada, MD;  Location: [facility] ENDOSCOPY;  Service: Endoscopy;  Laterality: N/A;  . THYROID SURGERY      OB History    No data available       Home Medications    Prior to Admission medications   Medication Sig Start Date End Date Taking? Authorizing Provider  amLODipine (NORVASC) 5 MG tablet Take 1 tablet (5 mg total) by mouth daily. 12/01/16   Velvet Bathe, MD  glipiZIDE (GLUCOTROL XL) 5 MG 24 hr tablet Take 5 mg by mouth daily. 10/04/16   Historical Provider, MD  levothyroxine (SYNTHROID, LEVOTHROID) 125 MCG tablet Take 125 mcg by mouth daily. 10/04/16   Historical Provider, MD  pantoprazole (PROTONIX) 40 MG tablet Take 1 tablet (40 mg total) by mouth 2 (two) times daily. 11/30/16   Velvet Bathe, MD  propranolol (INDERAL) 20 MG tablet Take 1 tablet (20 mg total) by mouth 2 (two) times daily. 11/30/16   Velvet Bathe, MD    Family History Family History  Problem Relation Age of Onset  . Diabetes Maternal Grandmother     Diabetic coma  . Hypertension Paternal Grandmother   . Cancer Neg Hx     Social History  Social History  Substance Use Topics  . Smoking status: Never Smoker  . Smokeless tobacco: Never Used  . Alcohol use No     Allergies   Patient has no known allergies.   Review of Systems Review of Systems  Ten systems reviewed and are negative for acute change, except as noted in the HPI.   Physical Exam Updated Vital Signs BP 102/74   Pulse 85   Temp 98.5 F (36.9 C)   Resp 18   SpO2 99%   Physical Exam  Constitutional: She is oriented to person, place, and time. She appears well-developed and well-nourished. No  distress.  The patient is very thin and protein malnourished.  HENT:  Head: Normocephalic and atraumatic.  Eyes: Conjunctivae are normal. Pupils are equal, round, and reactive to light. Scleral icterus is present.  Neck: Normal range of motion.  Cardiovascular: Normal rate, regular rhythm, normal heart sounds and intact distal pulses.  Exam reveals no gallop and no friction rub.   No murmur heard. Pulmonary/Chest: Effort normal and breath sounds normal. No respiratory distress.  Abdominal: Soft. Bowel sounds are normal. She exhibits no distension and no mass. There is no tenderness. There is no guarding.  Musculoskeletal: She exhibits edema.  Bilateral 3+ pitting edema of the lower extremities  Neurological: She is alert and oriented to person, place, and time.  Skin: Skin is warm. She is not diaphoretic.  Jaundice  Psychiatric: Her behavior is normal.  Nursing note and vitals reviewed.    ED Treatments / Results  Labs (all labs ordered are listed, but only abnormal results are displayed) Labs Reviewed  COMPREHENSIVE METABOLIC PANEL - Abnormal; Notable for the following:       Result Value   Sodium 125 (*)    Potassium 3.2 (*)    Chloride 95 (*)    CO2 20 (*)    Glucose, Bld 141 (*)    BUN 29 (*)    Creatinine, Ser 1.12 (*)    Calcium 7.7 (*)    Total Protein 9.8 (*)    Albumin 1.3 (*)    AST 155 (*)    Alkaline Phosphatase 928 (*)    Total Bilirubin 4.5 (*)    GFR calc non Af Amer 43 (*)    GFR calc Af Amer 50 (*)    All other components within normal limits  CBC - Abnormal; Notable for the following:    WBC 12.5 (*)    RBC 3.11 (*)    Hemoglobin 8.0 (*)    HCT 23.8 (*)    MCV 76.5 (*)    MCH 25.7 (*)    Platelets 449 (*)    All other components within normal limits  TYPE AND SCREEN    EKG  EKG Interpretation None       Radiology No results found.  Procedures Procedures (including critical care time)  Medications Ordered in ED Medications - No data  to display   Initial Impression / Assessment and Plan / ED Course  I have reviewed the triage vital signs and the nursing notes.  Pertinent labs & imaging results that were available during my care of the patient were reviewed by me and considered in my medical decision making (see chart for details).  Clinical Course as of Feb 14 2225  Tue Feb 14, 2017  2208 Hemoglobin: (!) 8.0 [AH]    Clinical Course User Index [AH] Margarita Mail, PA-C    Review of the EMR shows that the patient  had a PET scan and August 2017 showing multiple hypermetabolic lymph nodes in the chest and abdomen, which I believe is likely suggestive of metastatic pancreatic cancer. Review of the EMR shows that the patient also spoke with Dr. Hetty Blend back in August and stated that she did not want to see him anymore doctors and she will follow-up with her primary care physician. Today the patient also states that if she has a cancer. She is "just couldn't let God, and take me home." She does not wish to pursue aggressive treatment if this is the case. I do not see any previous palliative care consult for the patient.  Patient will be admittied for palliative care consult.  Suspect metastatic pancreatic cancer as her primary underlying dx.  Final Clinical Impressions(s) / ED Diagnoses   Final diagnoses:  Jaundice  Weakness  SOB (shortness of breath)  Anemia, unspecified type  Transaminitis    New Prescriptions New Prescriptions   No medications on file     Margarita Mail, PA-C 02/20/17 [place], MD 02/21/17 1148

## 2017-02-15 ENCOUNTER — Encounter (HOSPITAL_COMMUNITY): Payer: Self-pay | Admitting: Internal Medicine

## 2017-02-15 DIAGNOSIS — Z8249 Family history of ischemic heart disease and other diseases of the circulatory system: Secondary | ICD-10-CM | POA: Diagnosis not present

## 2017-02-15 DIAGNOSIS — D649 Anemia, unspecified: Secondary | ICD-10-CM | POA: Diagnosis not present

## 2017-02-15 DIAGNOSIS — Z7189 Other specified counseling: Secondary | ICD-10-CM | POA: Diagnosis not present

## 2017-02-15 DIAGNOSIS — R531 Weakness: Secondary | ICD-10-CM

## 2017-02-15 DIAGNOSIS — Z6825 Body mass index (BMI) 25.0-25.9, adult: Secondary | ICD-10-CM | POA: Diagnosis not present

## 2017-02-15 DIAGNOSIS — D509 Iron deficiency anemia, unspecified: Secondary | ICD-10-CM

## 2017-02-15 DIAGNOSIS — R740 Nonspecific elevation of levels of transaminase and lactic acid dehydrogenase [LDH]: Secondary | ICD-10-CM

## 2017-02-15 DIAGNOSIS — Z66 Do not resuscitate: Secondary | ICD-10-CM | POA: Diagnosis present

## 2017-02-15 DIAGNOSIS — E871 Hypo-osmolality and hyponatremia: Principal | ICD-10-CM

## 2017-02-15 DIAGNOSIS — E119 Type 2 diabetes mellitus without complications: Secondary | ICD-10-CM | POA: Diagnosis present

## 2017-02-15 DIAGNOSIS — E039 Hypothyroidism, unspecified: Secondary | ICD-10-CM | POA: Diagnosis present

## 2017-02-15 DIAGNOSIS — K766 Portal hypertension: Secondary | ICD-10-CM | POA: Diagnosis present

## 2017-02-15 DIAGNOSIS — J45909 Unspecified asthma, uncomplicated: Secondary | ICD-10-CM | POA: Diagnosis present

## 2017-02-15 DIAGNOSIS — K869 Disease of pancreas, unspecified: Secondary | ICD-10-CM | POA: Diagnosis present

## 2017-02-15 DIAGNOSIS — E860 Dehydration: Secondary | ICD-10-CM | POA: Diagnosis present

## 2017-02-15 DIAGNOSIS — E079 Disorder of thyroid, unspecified: Secondary | ICD-10-CM | POA: Diagnosis present

## 2017-02-15 DIAGNOSIS — Z833 Family history of diabetes mellitus: Secondary | ICD-10-CM | POA: Diagnosis not present

## 2017-02-15 DIAGNOSIS — R17 Unspecified jaundice: Secondary | ICD-10-CM

## 2017-02-15 DIAGNOSIS — K3189 Other diseases of stomach and duodenum: Secondary | ICD-10-CM

## 2017-02-15 DIAGNOSIS — Z515 Encounter for palliative care: Secondary | ICD-10-CM

## 2017-02-15 DIAGNOSIS — I10 Essential (primary) hypertension: Secondary | ICD-10-CM | POA: Diagnosis present

## 2017-02-15 DIAGNOSIS — R160 Hepatomegaly, not elsewhere classified: Secondary | ICD-10-CM | POA: Diagnosis present

## 2017-02-15 DIAGNOSIS — Z7984 Long term (current) use of oral hypoglycemic drugs: Secondary | ICD-10-CM | POA: Diagnosis not present

## 2017-02-15 DIAGNOSIS — I8500 Esophageal varices without bleeding: Secondary | ICD-10-CM | POA: Diagnosis present

## 2017-02-15 DIAGNOSIS — Z79899 Other long term (current) drug therapy: Secondary | ICD-10-CM | POA: Diagnosis not present

## 2017-02-15 DIAGNOSIS — R627 Adult failure to thrive: Secondary | ICD-10-CM | POA: Diagnosis present

## 2017-02-15 DIAGNOSIS — R0602 Shortness of breath: Secondary | ICD-10-CM

## 2017-02-15 DIAGNOSIS — E46 Unspecified protein-calorie malnutrition: Secondary | ICD-10-CM | POA: Diagnosis present

## 2017-02-15 DIAGNOSIS — C229 Malignant neoplasm of liver, not specified as primary or secondary: Secondary | ICD-10-CM | POA: Diagnosis present

## 2017-02-15 LAB — CBC WITH DIFFERENTIAL/PLATELET
BASOS ABS: 0 10*3/uL (ref 0.0–0.1)
Basophils Relative: 0 %
EOS ABS: 0 10*3/uL (ref 0.0–0.7)
Eosinophils Relative: 0 %
HCT: 22 % — ABNORMAL LOW (ref 36.0–46.0)
HEMOGLOBIN: 7.4 g/dL — AB (ref 12.0–15.0)
LYMPHS ABS: 1.2 10*3/uL (ref 0.7–4.0)
Lymphocytes Relative: 9 %
MCH: 25.8 pg — AB (ref 26.0–34.0)
MCHC: 33.6 g/dL (ref 30.0–36.0)
MCV: 76.7 fL — ABNORMAL LOW (ref 78.0–100.0)
MONO ABS: 0.4 10*3/uL (ref 0.1–1.0)
Monocytes Relative: 3 %
NEUTROS ABS: 12 10*3/uL — AB (ref 1.7–7.7)
Neutrophils Relative %: 88 %
Platelets: 363 10*3/uL (ref 150–400)
RBC: 2.87 MIL/uL — ABNORMAL LOW (ref 3.87–5.11)
RDW: 15.1 % (ref 11.5–15.5)
WBC: 13.6 10*3/uL — AB (ref 4.0–10.5)

## 2017-02-15 LAB — GLUCOSE, CAPILLARY
GLUCOSE-CAPILLARY: 40 mg/dL — AB (ref 65–99)
GLUCOSE-CAPILLARY: 97 mg/dL (ref 65–99)
GLUCOSE-CAPILLARY: 113 mg/dL — AB (ref 65–99)
GLUCOSE-CAPILLARY: 119 mg/dL — AB (ref 65–99)
Glucose-Capillary: 50 mg/dL — ABNORMAL LOW (ref 65–99)
Glucose-Capillary: 149 mg/dL — ABNORMAL HIGH (ref 65–99)

## 2017-02-15 LAB — BASIC METABOLIC PANEL
Anion gap: 8 (ref 5–15)
BUN: 25 mg/dL — AB (ref 6–20)
CALCIUM: 7.6 mg/dL — AB (ref 8.9–10.3)
CO2: 23 mmol/L (ref 22–32)
CREATININE: 1.18 mg/dL — AB (ref 0.44–1.00)
Chloride: 95 mmol/L — ABNORMAL LOW (ref 101–111)
GFR calc Af Amer: 47 mL/min — ABNORMAL LOW (ref >60)
GFR, EST NON AFRICAN AMERICAN: 41 mL/min — AB (ref >60)
GLUCOSE: 50 mg/dL — AB (ref 65–99)
Potassium: 3 mmol/L — ABNORMAL LOW (ref 3.5–5.1)
SODIUM: 126 mmol/L — AB (ref 135–145)

## 2017-02-15 LAB — SODIUM, URINE, RANDOM: SODIUM UR: 12 mmol/L

## 2017-02-15 LAB — HEPATIC FUNCTION PANEL
ALT: 27 U/L (ref 14–54)
AST: 154 U/L — ABNORMAL HIGH (ref 15–41)
Albumin: 1.2 g/dL — ABNORMAL LOW (ref 3.5–5.0)
Alkaline Phosphatase: 811 U/L — ABNORMAL HIGH (ref 38–126)
BILIRUBIN INDIRECT: 2.2 mg/dL — AB (ref 0.3–0.9)
BILIRUBIN TOTAL: 5.6 mg/dL — AB (ref 0.3–1.2)
Bilirubin, Direct: 3.4 mg/dL — ABNORMAL HIGH (ref 0.1–0.5)
TOTAL PROTEIN: 8.7 g/dL — AB (ref 6.5–8.1)

## 2017-02-15 LAB — CG4 I-STAT (LACTIC ACID): Lactic Acid, Venous: 1.6 mmol/L (ref 0.5–1.9)

## 2017-02-15 MED ORDER — SODIUM CHLORIDE 0.9 % IV SOLN
INTRAVENOUS | Status: DC
  Administered 2017-02-15 – 2017-02-16 (×2): via INTRAVENOUS

## 2017-02-15 MED ORDER — AMLODIPINE-VALSARTAN-HCTZ 5-160-12.5 MG PO TABS: 1.0000 | ORAL_TABLET | Freq: Every day | ORAL | Status: DC

## 2017-02-15 MED ORDER — INSULIN ASPART 100 UNIT/ML ~~LOC~~ SOLN
0.0000 [IU] | Freq: Three times a day (TID) | SUBCUTANEOUS | Status: DC
  Administered 2017-02-16 (×2): 1 [IU] via SUBCUTANEOUS

## 2017-02-15 MED ORDER — PANTOPRAZOLE SODIUM 40 MG PO TBEC
40.0000 mg | DELAYED_RELEASE_TABLET | Freq: Two times a day (BID) | ORAL | Status: DC
  Administered 2017-02-15 – 2017-02-16 (×3): 40 mg via ORAL
  Filled 2017-02-15 (×3): qty 1

## 2017-02-15 MED ORDER — ONDANSETRON HCL 4 MG PO TABS: 4.0000 mg | ORAL_TABLET | Freq: Four times a day (QID) | ORAL | Status: DC | PRN

## 2017-02-15 MED ORDER — ACETAMINOPHEN 650 MG RE SUPP: 650.0000 mg | Freq: Four times a day (QID) | RECTAL | Status: DC | PRN

## 2017-02-15 MED ORDER — POTASSIUM CHLORIDE CRYS ER 20 MEQ PO TBCR
40.0000 meq | EXTENDED_RELEASE_TABLET | Freq: Once | ORAL | Status: AC
  Administered 2017-02-15: 40 meq via ORAL
  Filled 2017-02-15: qty 2

## 2017-02-15 MED ORDER — ACETAMINOPHEN 325 MG PO TABS: 650.0000 mg | ORAL_TABLET | Freq: Four times a day (QID) | ORAL | Status: DC | PRN

## 2017-02-15 MED ORDER — SERTRALINE HCL 50 MG PO TABS
25.0000 mg | ORAL_TABLET | Freq: Every day | ORAL | Status: DC
  Administered 2017-02-15 – 2017-02-16 (×2): 25 mg via ORAL
  Filled 2017-02-15 (×2): qty 1

## 2017-02-15 MED ORDER — HYDRALAZINE HCL 20 MG/ML IJ SOLN: 10.0000 mg | INTRAMUSCULAR | Status: DC | PRN

## 2017-02-15 MED ORDER — ONDANSETRON HCL 4 MG/2ML IJ SOLN: 4.0000 mg | Freq: Four times a day (QID) | INTRAMUSCULAR | Status: DC | PRN

## 2017-02-15 MED ORDER — METOPROLOL SUCCINATE ER 50 MG PO TB24
50.0000 mg | ORAL_TABLET | Freq: Every day | ORAL | Status: DC
  Administered 2017-02-15 – 2017-02-16 (×2): 50 mg via ORAL
  Filled 2017-02-15 (×2): qty 1

## 2017-02-15 MED ORDER — HYDROCHLOROTHIAZIDE 12.5 MG PO CAPS: 12.5000 mg | ORAL_CAPSULE | Freq: Every day | ORAL | Status: DC

## 2017-02-15 MED ORDER — LEVOTHYROXINE SODIUM 100 MCG PO TABS
125.0000 ug | ORAL_TABLET | Freq: Every day | ORAL | Status: DC
  Administered 2017-02-15 – 2017-02-16 (×2): 125 ug via ORAL
  Filled 2017-02-15 (×2): qty 1

## 2017-02-15 MED ORDER — AMLODIPINE BESYLATE 5 MG PO TABS
5.0000 mg | ORAL_TABLET | Freq: Every day | ORAL | Status: DC
  Administered 2017-02-16: 5 mg via ORAL
  Filled 2017-02-15 (×2): qty 1

## 2017-02-15 MED ORDER — IRBESARTAN 150 MG PO TABS: 150.0000 mg | ORAL_TABLET | Freq: Every day | ORAL | Status: DC

## 2017-02-15 NOTE — Progress Notes (Signed)
Received patient from ED. Patient AOx4, denies pain, skin intact, VS stable with soft BP and O2Sat at 97% on RA.  Oriented patient to room, bed controls and call light. Patient resting on bed comfortably after drinking some water and call light within reach.  Paged [place] admission and [facility] floor coverage NP of patient's arrival to floor.  Awaiting for further orders.

## 2017-02-15 NOTE — Progress Notes (Signed)
Patient seen and examined at bedside, patient admitted after midnight, please see earlier detailed admission note by Rise Patience, MD. Briefly, patient presented with abdominal pain, nausea and vomiting. Symptoms secondary to known CBD secondary to likely cancer. Palliative care medicine consulted for goals of care. Hypoglycemia improved with oral intake earlier today.  Cordelia Poche, MD [facility] 02/15/2017, 2:31 PM Pager: [phone]

## 2017-02-15 NOTE — Progress Notes (Signed)
Inpatient Diabetes Program Recommendations  AACE/ADA: New Consensus Statement on Inpatient Glycemic Control (2015)  Target Ranges:  Prepandial:   less than 140 mg/dL      Peak postprandial:   less than 180 mg/dL (1-2 hours)      Critically ill patients:  140 - 180 mg/dL   Lab Results  Component Value Date   GLUCAP 149 (H) 02/15/2017   HGBA1C 5.9 (H) 11/25/2016    Review of Glycemic ControlResults for VEDIKA, DUMLAO (MRN [id number]) as of 02/15/2017 14:56  Ref. Range 02/15/2017 08:15 02/15/2017 08:36 02/15/2017 09:18 02/15/2017 12:06  Glucose-Capillary Latest Ref Range: 65 - 99 mg/dL 40 (LL) 50 (L) 97 149 (H)    Diabetes history: Type 2 diabetes Outpatient Diabetes medications: Glipizide XL 5 mg daily Current orders for Inpatient glycemic control:  Novolog sensitive tid with meals   Inpatient Diabetes Program Recommendations:   Please consider not continuing Glipizide at d/c due to risk of hypoglycemia.   Thanks, Adah Perl, RN, BC-ADM Inpatient Diabetes Coordinator Pager [phone]

## 2017-02-15 NOTE — H&P (Signed)
History and Physical    [name] ENI:[id number] DOB: [birth year] DOA: 02/14/2017  PCP: Thressa Sheller, MD  Patient coming from: Home.  Chief Complaint: Abnormal labs.  HPI: [name] is a 83 y.o. female with history of diabetes mellitus type 2, hypertension, hypothyroidism who was admitted last December for GI bleed and had undergone variceal banding and had portal gastropathy and CAT scan showed intra-and extrahepatic biliary dilatation with elevated CA 19 concerning for malignancy was referred to the ER by patient's primary care physician after patient's hemoglobin was found to be around 8. Patient is per the patient's grandson was getting increasingly weak and lethargic last 3-4 days. Has not had any nausea vomiting or diarrhea. Denies any chest pain or shortness of breath.   ED Course: In the ER hemoglobin is found to be around 8 with sodium around 125. Patient on exam appeared generally weak. Denies any blood in the stools or notice any bleeding. Patient is being admitted for further observation for generalized weakness and failure to thrive.  Review of Systems: As per HPI, rest all negative.   Past Medical History:  Diagnosis Date  . Asthma   . Diabetes mellitus without complication ([place])   . Hypertension   . Thyroid disease     Past Surgical History:  Procedure Laterality Date  . APPENDECTOMY    . CHOLECYSTECTOMY    . ESOPHAGOGASTRODUODENOSCOPY N/A 11/25/2016   Procedure: ESOPHAGOGASTRODUODENOSCOPY (EGD);  Surgeon: Carol Ada, MD;  Location: [facility] ENDOSCOPY;  Service: Endoscopy;  Laterality: N/A;  . THYROID SURGERY       reports that she has never smoked. She has never used smokeless tobacco. She reports that she does not drink alcohol or use drugs.  No Known Allergies  Family History  Problem Relation Age of Onset  . Diabetes Maternal Grandmother     Diabetic coma  . Hypertension Paternal Grandmother   . Cancer Neg Hx     Prior to Admission  medications   Medication Sig Start Date End Date Taking? Authorizing Provider  Amlodipine-Valsartan-HCTZ 5-160-12.5 MG TABS Take 1 tablet by mouth daily.   Yes Historical Provider, MD  glipiZIDE (GLUCOTROL XL) 5 MG 24 hr tablet Take 5 mg by mouth daily. 10/04/16  Yes Historical Provider, MD  levothyroxine (SYNTHROID, LEVOTHROID) 125 MCG tablet Take 125 mcg by mouth daily. 10/04/16  Yes Historical Provider, MD  metoprolol succinate (TOPROL-XL) 50 MG 24 hr tablet Take 50 mg by mouth daily. Take with or immediately following a meal.   Yes Historical Provider, MD  pantoprazole (PROTONIX) 40 MG tablet Take 1 tablet (40 mg total) by mouth 2 (two) times daily. 11/30/16  Yes Velvet Bathe, MD  sertraline (ZOLOFT) 25 MG tablet Take 25 mg by mouth daily.   Yes Historical Provider, MD    Physical Exam: Vitals:   02/15/17 0100 02/15/17 0130 02/15/17 0200 02/15/17 0247  BP: (!) 108/55 (!) 119/57 (!) 110/55 (!) 107/59  Pulse: 74 79 78 78  Resp: 18 18 17 19   Temp:    98.4 F (36.9 C)  TempSrc:    Oral  SpO2: 93% 93% 96% 97%  Weight:    56.7 kg (125 lb)  Height:    4\' 11"  (1.499 m)      Constitutional: Moderately built and poorly nourished. Vitals:   02/15/17 0100 02/15/17 0130 02/15/17 0200 02/15/17 0247  BP: (!) 108/55 (!) 119/57 (!) 110/55 (!) 107/59  Pulse: 74 79 78 78  Resp: 18 18 17 19   Temp:  98.4 F (36.9 C)  TempSrc:    Oral  SpO2: 93% 93% 96% 97%  Weight:    56.7 kg (125 lb)  Height:    4\' 11"  (1.499 m)   Eyes: Anicteric mild pallor. ENMT: No discharge from the years eyes nose or mouth. Neck: No mass felt. No neck rigidity. Respiratory: No rhonchi or crepitations. Cardiovascular: S1 and S2 heard no murmurs appreciated. Abdomen: Distended nontender bowel sounds present. Musculoskeletal: No edema. Skin: No rash. Neurologic: Alert awake oriented to time place and person moves all extremities. Psychiatric: Appears normal.   Labs on Admission: I have personally reviewed following  labs and imaging studies  CBC:  Recent Labs Lab 02/14/17 1732  WBC 12.5*  HGB 8.0*  HCT 23.8*  MCV 76.5*  PLT 756*   Basic Metabolic Panel:  Recent Labs Lab 02/14/17 1732  NA 125*  K 3.2*  CL 95*  CO2 20*  GLUCOSE 141*  BUN 29*  CREATININE 1.12*  CALCIUM 7.7*   GFR: Estimated Creatinine Clearance: 27.7 mL/min (A) (by C-G formula based on SCr of 1.12 mg/dL (H)). Liver Function Tests:  Recent Labs Lab 02/14/17 1732  AST 155*  ALT 26  ALKPHOS 928*  BILITOT 4.5*  PROT 9.8*  ALBUMIN 1.3*   No results for input(s): LIPASE, AMYLASE in the last 168 hours. No results for input(s): AMMONIA in the last 168 hours. Coagulation Profile: No results for input(s): INR, PROTIME in the last 168 hours. Cardiac Enzymes: No results for input(s): CKTOTAL, CKMB, CKMBINDEX, TROPONINI in the last 168 hours. BNP (last 3 results) No results for input(s): PROBNP in the last 8760 hours. HbA1C: No results for input(s): HGBA1C in the last 72 hours. CBG: No results for input(s): GLUCAP in the last 168 hours. Lipid Profile: No results for input(s): CHOL, HDL, LDLCALC, TRIG, CHOLHDL, LDLDIRECT in the last 72 hours. Thyroid Function Tests: No results for input(s): TSH, T4TOTAL, FREET4, T3FREE, THYROIDAB in the last 72 hours. Anemia Panel: No results for input(s): VITAMINB12, FOLATE, FERRITIN, TIBC, IRON, RETICCTPCT in the last 72 hours. Urine analysis:    Component Value Date/Time   COLORURINE AMBER (A) 11/25/2016 0227   APPEARANCEUR CLEAR 11/25/2016 0227   LABSPEC 1.013 11/25/2016 0227   PHURINE 5.0 11/25/2016 0227   GLUCOSEU NEGATIVE 11/25/2016 0227   HGBUR MODERATE (A) 11/25/2016 0227   BILIRUBINUR NEGATIVE 11/25/2016 0227   KETONESUR NEGATIVE 11/25/2016 0227   PROTEINUR NEGATIVE 11/25/2016 0227   UROBILINOGEN 0.2 05/19/2008 0910   NITRITE NEGATIVE 11/25/2016 0227   LEUKOCYTESUR NEGATIVE 11/25/2016 0227   Sepsis Labs: @LABRCNTIP (procalcitonin:4,lacticidven:4) )No results  found for this or any previous visit (from the past 240 hour(s)).   Radiological Exams on Admission: Dg Chest 2 View  Result Date: 02/15/2017 CLINICAL DATA:  Acute onset of shortness of breath. Generalized weakness. Initial encounter. EXAM: CHEST  2 VIEW COMPARISON:  Chest radiograph performed 11/25/2016 FINDINGS: The lungs are mildly hypoexpanded. Mild vascular crowding and vascular congestion are seen. There is no evidence of pleural effusion or pneumothorax. The heart is normal in size; the mediastinal contour is within normal limits. No acute osseous abnormalities are seen. Clips are noted within the right upper quadrant, reflecting prior cholecystectomy. IMPRESSION: Lungs mildly hypoexpanded.  Vascular congestion noted. Electronically Signed   By: Garald Balding M.D.   On: 02/15/2017 00:21     Assessment/Plan Principal Problem:   Weakness Active Problems:   Hyponatremia   Microcytic anemia   Portal hypertensive gastropathy    1. Generalized weakness and  failure to thrive - will get palliative care consult. 2. Microcytic hypochromic anemia with history of GI bleed - follow CBC. 3. Elevated alkaline phosphatase level with extra and intrahepatic biliary dilatation or CAT scan done recently - patient does not want any aggressive workup or procedures done. Palliative care consult. 4. Hyponatremia - probably from dehydration we'll hold her hydrochlorothiazide. Check urine sodium. 5. Hypertension with low normal blood pressure I'm holding off hydrochlorothiazide and ARB and keeping patient on when necessary IV hydralazine. Continue beta blockers. 6. Diabetes mellitus type 2 will keep patient on sliding scale coverage.   DVT prophylaxis: SCDs. Code Status: DO NOT RESUSCITATE.  Family Communication: Patient's grandson.  Disposition Plan: To be determined.  Consults called: Palliative care.  Admission status: Observation.    Rise Patience MD [facility] Pager [phone].  If 7PM-7AM, please contact night-coverage [email or web address] Password [facility]  02/15/2017, 3:36 AM

## 2017-02-15 NOTE — Consult Note (Signed)
Consultation Note Date: 02/15/2017   Patient Name: [name]  DOB: [birth year]  MRN: [id number]  Age / Sex: 83 y.o., female  PCP: Thressa Sheller, MD Referring Physician: Mariel Aloe, MD  Reason for Consultation: Establishing goals of care  HPI/Patient Profile: 83 y.o. female  with past medical history of DMII, HTN, hypothyroidism, portal hypertensive gastropathy, GI bleeding,s/p banded bleeding esophogeal varices 12/17, elevated CA-19 and dilated intra and extra hepatic biliary ductal dilatation concerning for hepatic cancer (for which she has desired no further workup) admitted on 02/14/2017 with weakness, and anemia. Palliative medicine consulted for [place].  Clinical Assessment and Goals of Care: Met bedside with patient and spoke with patient's grandson, [place] via telephone. Prior to admission she was living in her home with her grandson [place]. Alfonse Spruce is the Recruitment consultant for a lab services company locally and has been taking excellent care of his grandmother. Prince's mother, Marlynn Perking-  Ida's daughter, is a respiratory therapist in [place] working at the [facility] as the personal caretaker of one of the [place] who was in an ATV accident several years ago.   Tonia Ghent tells me she knows she has cancer. She has lost weight rapidly over the last few months (chart review shows she weighed 140 lbs in January, now she is down to 125lbs this admission); she is increasingly fatigued, her appetite is decreasing. She doesn't care to do any further workup or aggressive therapies. She says she is ready to go to God. She is familiar with Hospice and has a friend who had Hospice come to her home who lives down the street from her. We discussed Hospice services in her home and she is interested in returning home with Hospice support. She expresses concern that her family will not accept this. "They want  their Jacquelynn Cree to live forever, but I'm not going to live forever". She requests our help in communicating her wishes to family.  Spoke with [facility] via phone. He describe the last year. His Grandmother fell in May. She began to have a slow functional decline then. He's noticed subtle personality changes over the last year- feel she has become more distant, grumpy at times, withdrawn. The past few months her decline has been quite rapid. Her po intake has dropped dramatically. Her weight has decreased. She has days she doesn't get out of bed. He stated his goals would be for his grandmother to get better, but he knows that she probably has cancer and that is not going to get better. He wants to be certain she doesn't suffer. I shared with Alfonse Spruce his Grandmother's concerns re: accepting her wishes. He acknowledged that sometimes the family would push their Grandmother, but stated she was competent and they would abide by and support her decisions. He agreed to joint GOC. He is also accepting to Hospice services at home.   Primary Decision Maker PATIENT    SUMMARY OF RECOMMENDATIONS -Comfort measures -Followup GOC meeting with grandson Alfonse Spruce and patient for d/c planning- home with Hospice  v SNF w/ hospice or she may be eligible for residential hospice  Code Status/Advance Care Planning:  DNR  Palliative Prophylaxis:   Frequent Pain Assessment  Additional Recommendations (Limitations, Scope, Preferences):  No Diagnostics, No Radiation and No Surgical Procedures  Psycho-social/Spiritual:   Desire for further Chaplaincy support:yes  Additional Recommendations: Education on Hospice  Prognosis:    Unable to determine  Discharge Planning: To Be Determined  Primary Diagnoses: Present on Admission: . Portal hypertensive gastropathy . Hyponatremia . Microcytic anemia   I have reviewed the medical record, interviewed the patient and family, and examined the patient. The following aspects  are pertinent.  Past Medical History:  Diagnosis Date  . Asthma   . Diabetes mellitus without complication ([place])   . Hypertension   . Pancreatic mass   . Thyroid disease    Social History   Social History  . Marital status: Widowed    Spouse name: N/A  . Number of children: N/A  . Years of education: N/A   Social History Main Topics  . Smoking status: Never Smoker  . Smokeless tobacco: Never Used  . Alcohol use No  . Drug use: No  . Sexual activity: No   Other Topics Concern  . None   Social History Narrative  . None   Family History  Problem Relation Age of Onset  . Diabetes Maternal Grandmother     Diabetic coma  . Hypertension Paternal Grandmother   . Cancer Neg Hx    Scheduled Meds: . amLODipine  5 mg Oral Daily  . insulin aspart  0-9 Units Subcutaneous TID WC  . levothyroxine  125 mcg Oral QAC breakfast  . metoprolol succinate  50 mg Oral Daily  . pantoprazole  40 mg Oral BID  . sertraline  25 mg Oral Daily   Continuous Infusions: . sodium chloride 100 mL/hr at 02/15/17 1551   PRN Meds:.acetaminophen **OR** acetaminophen, hydrALAZINE, ondansetron **OR** ondansetron (ZOFRAN) IV Medications Prior to Admission:  Prior to Admission medications   Medication Sig Start Date End Date Taking? Authorizing Provider  Amlodipine-Valsartan-HCTZ 5-160-12.5 MG TABS Take 1 tablet by mouth daily.   Yes Historical Provider, MD  glipiZIDE (GLUCOTROL XL) 5 MG 24 hr tablet Take 5 mg by mouth daily. 10/04/16  Yes Historical Provider, MD  levothyroxine (SYNTHROID, LEVOTHROID) 125 MCG tablet Take 125 mcg by mouth daily. 10/04/16  Yes Historical Provider, MD  metoprolol succinate (TOPROL-XL) 50 MG 24 hr tablet Take 50 mg by mouth daily. Take with or immediately following a meal.   Yes Historical Provider, MD  pantoprazole (PROTONIX) 40 MG tablet Take 1 tablet (40 mg total) by mouth 2 (two) times daily. 11/30/16  Yes Velvet Bathe, MD  sertraline (ZOLOFT) 25 MG tablet Take 25 mg by  mouth daily.   Yes Historical Provider, MD   No Known Allergies Review of Systems  Constitutional: Positive for activity change, appetite change and fatigue.  All other systems reviewed and are negative.   Physical Exam  Constitutional: She is oriented to person, place, and time.  Frail, elderly, appears stated age  HENT:  Head: Normocephalic and atraumatic.  Eyes: Scleral icterus is present.  Cardiovascular: Normal rate and regular rhythm.   Pulmonary/Chest: Effort normal.  Abdominal: Soft. Bowel sounds are normal.  Neurological: She is alert and oriented to person, place, and time.  Psychiatric: She has a normal mood and affect. Her behavior is normal. Judgment and thought content normal.  Nursing note and vitals reviewed.   Vital Signs:  BP 122/64 (BP Location: Right Arm)   Pulse 76   Temp 97.9 F (36.6 C) (Oral)   Resp 18   Ht '4\' 11"'$  (1.499 m)   Wt 56.7 kg (125 lb)   SpO2 100%   BMI 25.25 kg/m  Pain Assessment: No/denies pain   Pain Score: 0-No pain   SpO2: SpO2: 100 % O2 Device:SpO2: 100 % O2 Flow Rate: .   IO: Intake/output summary:  Intake/Output Summary (Last 24 hours) at 02/15/17 1800 Last data filed at 02/15/17 1700  Gross per 24 hour  Intake              435 ml  Output              150 ml  Net              285 ml    LBM: Last BM Date: 02/13/17 Baseline Weight: Weight: 56.7 kg (125 lb) Most recent weight: Weight: 56.7 kg (125 lb)     Palliative Assessment/Data:PPS: 30%   Flowsheet Rows     Most Recent Value  Intake Tab  Referral Department  Hospitalist  Unit at Time of Referral  Med/Surg Unit  Palliative Care Primary Diagnosis  Other (Comment) [FTT]  Date Notified  02/15/17  Palliative Care Type  New Palliative care  Reason for referral  Clarify Goals of Care  Date of Admission  02/14/17  # of days IP prior to Palliative referral  1  Clinical Assessment  Psychosocial & Spiritual Assessment  Palliative Care Outcomes      Thank you for  this consult. Palliative medicine will continue to follow and assist as needed.   Time Total: 120 min Greater than 50%  of this time was spent counseling and coordinating care related to the above assessment and plan.  Signed by: Mariana Kaufman, AGNP-C Palliative Medicine    Please contact Palliative Medicine Team phone at [phone] for questions and concerns.  For individual provider: See Shea Evans

## 2017-02-15 NOTE — Progress Notes (Signed)
Hypoglycemic Event  CBG: 40  Treatment: 15 GM carbohydrate snack  Symptoms: Hungry  Follow-up CBG: Time:0836 CBG Result:50  Possible Reasons for Event: Inadequate meal intake  Comments/MD notified: Message sent thru Amion to Dr Lonny Prude. @ 6153 CBG after breakfast [place]

## 2017-02-16 DIAGNOSIS — Z515 Encounter for palliative care: Secondary | ICD-10-CM

## 2017-02-16 DIAGNOSIS — Z7189 Other specified counseling: Secondary | ICD-10-CM

## 2017-02-16 DIAGNOSIS — E46 Unspecified protein-calorie malnutrition: Secondary | ICD-10-CM

## 2017-02-16 LAB — CBC
HCT: 21.8 % — ABNORMAL LOW (ref 36.0–46.0)
Hemoglobin: 7.2 g/dL — ABNORMAL LOW (ref 12.0–15.0)
MCH: 25.4 pg — AB (ref 26.0–34.0)
MCHC: 33 g/dL (ref 30.0–36.0)
MCV: 76.8 fL — AB (ref 78.0–100.0)
PLATELETS: 357 10*3/uL (ref 150–400)
RBC: 2.84 MIL/uL — AB (ref 3.87–5.11)
RDW: 15 % (ref 11.5–15.5)
WBC: 12.2 10*3/uL — ABNORMAL HIGH (ref 4.0–10.5)

## 2017-02-16 LAB — GLUCOSE, CAPILLARY
GLUCOSE-CAPILLARY: 126 mg/dL — AB (ref 65–99)
GLUCOSE-CAPILLARY: 139 mg/dL — AB (ref 65–99)
Glucose-Capillary: 87 mg/dL (ref 65–99)

## 2017-02-16 NOTE — Discharge Instructions (Signed)
Hospice °Introduction °Hospice is a service that is designed to provide people who are terminally ill and their families with medical, spiritual, and psychological support. Its aim is to improve your quality of life by keeping you as alert and comfortable as possible. °Who will be my providers when I begin hospice care? °Hospice teams often include: °· A nurse. °· A doctor. The hospice doctor will be available for your care, but you can bring your regular doctor or nurse practitioner. °· Social workers. °· Religious leaders (such as a chaplain). °· Trained volunteers. °What roles will providers play in my care? °Hospice is performed by a team of health care professionals and volunteers who: °· Help keep you comfortable: °¨ Hospice can be provided in your home or in a homelike setting. °¨ The hospice staff works with your family and friends to help meet your needs. °¨ You will enjoy the support of loved ones by receiving much of your basic care from family and friends. °· Provide pain relief and manage your symptoms. The staff supply all necessary medicines and equipment. °· Provide companionship when you are alone. °· Allow you and your family to rest. They may do light housekeeping, prepare meals, and run errands. °· Provide counseling. They will make sure your emotional, spiritual, and social needs and those of your family are being met. °· Provide spiritual care: °¨ Spiritual care will be individualized to meet your needs and your family's needs. °¨ Spiritual care may involve: °§ Helping you look at what death means to you. °§ Helping you say goodbye to your family and friends. °§ Performing a specific religious ceremony or ritual. °When should hospice care begin? °Most people who use hospice are believed to have fewer than 6 months to live. °· Your family and health care providers can help you decide when hospice services should begin. °· If your condition improves, you may discontinue the program. °What should  I consider before selecting a program? °Most hospice programs are run by nonprofit, independent organizations. Some are affiliated with hospitals, nursing homes, or home health care agencies. Hospice programs can take place in the home or at a hospice center, hospital, or skilled nursing facility. When choosing a hospice program, ask the following questions: °· What services are available to me? °· What services will be offered to my loved ones? °· How involved will my loved ones be? °· How involved will my health care provider be? °· Who makes up the hospice care team? How are they trained or screened? °· How will my pain and symptoms be managed? °· If my circumstances change, can the services be provided in a different setting, such as my home or in the hospital? °· Is the program reviewed and licensed by the state or certified in some other way? °Where can I learn more about hospice? °You can learn about existing hospice programs in your area from your health care providers. You can also read more about hospice online. The websites of the following organizations contain helpful information: °· The National Hospice and Palliative Care Organization (NHPCO). °· The Hospice Association of America (HAA). °· The Hospice Education Institute. °· The American Cancer Society (ACS). °· Hospice Net. °This information is not intended to replace advice given to you by your health care provider. Make sure you discuss any questions you have with your health care provider. °Document Released: 03/02/2004 Document Revised: 06/30/2016 Document Reviewed: 09/24/2013 °Elsevier Interactive Patient Education © 2017 Elsevier Inc. ° °

## 2017-02-16 NOTE — Progress Notes (Signed)
[facility] Liaison RN visit:  Notified by Almyra Free [facility] of patient/family request for Hospice and [place] services at home after discharge.  Chart and patient information being reviewed with Dr. Brigid Re, [place] Director.  Hospice eligibility confirmed.   Writer spoke with patient at bedside to initiate education related to hospice philosophy, services and team approach to care.  There was no family available at bedside.  Per discussion, plan is for discharge to home by personal vehicle today.  Please send signed completed DNR form home with patient/family.  Patient will need prescriptions for discharge comfort medications.  DME needs discussed with patient - she currently has a walker in the home.  Patient request the following DMR for delivery to the home today:  Hospital bed 1/2 rails, OBT, 3 in 1 BSC.  Denies need for additional equipment at this time.  [facility] Chartered certified accountant, Gayland Curry, notified and will contact [place] to arrange delivery to the home.  The home address has been verified and is correct in the chart.  Alfonse Spruce, grandson, is the family member to be contacted to arrange time of delivery.  [facility] Referral Center aware of above.   Completed d/c summary will need to be faxed to [facility] at [phone], when final.  Please notify [facility] when patient is ready to leave unti at discharge - call [facility] at [phone] (or [phone] after 5pm).  [facility] information and contact numbers have been given to patient during visit.  Above information shared with Almyra Free, [facility].  Please call with any questions.  Thank you for the referral.  Edyth Gunnels, RN, [facility] Liaison [phone]  All hospital liaison's are now on [place].

## 2017-02-16 NOTE — Discharge Summary (Signed)
Physician Discharge Summary  GWYNN CHALKER FMB:[id number] DOB: 1928/02/20 DOA: 02/14/2017  PCP: Thressa Sheller, MD  Admit date: 02/14/2017 Discharge date: 02/16/2017  Admitted From: Home Disposition: Home with hospice  Recommendations for Outpatient Follow-up:  1. Follow up with hospice  Home Health: Hospice Equipment/Devices: 3-in-1  Discharge Condition: Comfort care/Hospice CODE STATUS: DNR/DNI Diet recommendation: Regular diet   Brief/Interim Summary:  Admission HPI written by Rise Patience, MD   Chief Complaint: Abnormal labs.  HPI: [name] is a 83 y.o. female with history of diabetes mellitus type 2, hypertension, hypothyroidism who was admitted last December for GI bleed and had undergone variceal banding and had portal gastropathy and CAT scan showed intra-and extrahepatic biliary dilatation with elevated CA 19 concerning for malignancy was referred to the ER by patient's primary care physician after patient's hemoglobin was found to be around 8. Patient is per the patient's grandson was getting increasingly weak and lethargic last 3-4 days. Has not had any nausea vomiting or diarrhea. Denies any chest pain or shortness of breath.   ED Course: In the ER hemoglobin is found to be around 8 with sodium around 125. Patient on exam appeared generally weak. Denies any blood in the stools or notice any bleeding. Patient is being admitted for further observation for generalized weakness and failure to thrive.    Hospital course:  Generalized weakness Failure to thrive Secondary to known hepatic mass thought to be secondary to cancerous process. Patient met with palliative care medicine and has been set up for home hospice. DNR/DNI with no aggressive measures.  Elevated alkaline phosphatase Secondary to mass. No further workup.  Hyponatremia Secondary to dehydration. IV fluids given.  Hypertension Continued antihypertensives.  Diabetes mellitus Blood  sugars controlled and sometimes low. Discontinued glipizide on discharge.  Discharge Diagnoses:  Principal Problem:   Weakness Active Problems:   Hyponatremia   Microcytic anemia   Portal hypertensive gastropathy   Protein-calorie malnutrition ([place])   Goals of care, counseling/discussion   Advance care planning   Palliative care by specialist    Discharge Instructions   Allergies as of 02/16/2017   No Known Allergies     Medication List    STOP taking these medications   glipiZIDE 5 MG 24 hr tablet Commonly known as:  GLUCOTROL XL     TAKE these medications   Amlodipine-Valsartan-HCTZ 5-160-12.5 MG Tabs Take 1 tablet by mouth daily.   levothyroxine 125 MCG tablet Commonly known as:  SYNTHROID, LEVOTHROID Take 125 mcg by mouth daily.   metoprolol succinate 50 MG 24 hr tablet Commonly known as:  TOPROL-XL Take 50 mg by mouth daily. Take with or immediately following a meal.   pantoprazole 40 MG tablet Commonly known as:  PROTONIX Take 1 tablet (40 mg total) by mouth 2 (two) times daily.   sertraline 25 MG tablet Commonly known as:  ZOLOFT Take 25 mg by mouth daily.            Durable Medical Equipment        Start     Ordered   02/16/17 1328  For home use only DME 3 n 1  Once     02/16/17 1327      No Known Allergies  Consultations:  Palliative care medicine   Procedures/Studies: Dg Chest 2 View  Result Date: 02/15/2017 CLINICAL DATA:  Acute onset of shortness of breath. Generalized weakness. Initial encounter. EXAM: CHEST  2 VIEW COMPARISON:  Chest radiograph performed 11/25/2016 FINDINGS: The lungs are mildly  hypoexpanded. Mild vascular crowding and vascular congestion are seen. There is no evidence of pleural effusion or pneumothorax. The heart is normal in size; the mediastinal contour is within normal limits. No acute osseous abnormalities are seen. Clips are noted within the right upper quadrant, reflecting prior cholecystectomy.  IMPRESSION: Lungs mildly hypoexpanded.  Vascular congestion noted. Electronically Signed   By: Garald Balding M.D.   On: 02/15/2017 00:21     Subjective: Patient reports no abdominal pain, nausea or vomiting.  Discharge Exam: Vitals:   02/16/17 0931 02/16/17 1507  BP: (!) 116/58 126/65  Pulse: 64 61  Resp:  17  Temp:  98.6 F (37 C)   Vitals:   02/15/17 2129 02/16/17 0525 02/16/17 0931 02/16/17 1507  BP: 117/62 122/64 (!) 116/58 126/65  Pulse: 74 67 64 61  Resp: _0 Temp: 98.2 F (36.8 C) 98.4 F (36.9 C)  98.6 F (37 C)  TempSrc: Oral Oral  Oral  SpO2: 100% 100%  100%  Weight:      Height:        General: Pt is alert, awake, not in acute distress Eyes: jaundice Cardiovascular: RRR, S1/S2 +, no rubs, no gallops Respiratory: CTA bilaterally, no wheezing, no rhonchi Abdominal: Soft, NT, some distension, bowel sounds + Extremities: no edema, no cyanosis    The results of significant diagnostics from this hospitalization (including imaging, microbiology, ancillary and laboratory) are listed below for reference.     Microbiology: No results found for this or any previous visit (from the past 240 hour(s)).   Labs: BNP (last 3 results)  Recent Labs  02/14/17 2255  BNP 989.2*   Basic Metabolic Panel:  Recent Labs Lab 02/14/17 1732 02/15/17 0340  NA 125* 126*  K 3.2* 3.0*  CL 95* 95*  CO2 20* 23  GLUCOSE 141* 50*  BUN 29* 25*  CREATININE 1.12* 1.18*  CALCIUM 7.7* 7.6*   Liver Function Tests:  Recent Labs Lab 02/14/17 1732 02/15/17 0340  AST 155* 154*  ALT 26 27  ALKPHOS 928* 811*  BILITOT 4.5* 5.6*  PROT 9.8* 8.7*  ALBUMIN 1.3* 1.2*   No results for input(s): LIPASE, AMYLASE in the last 168 hours. No results for input(s): AMMONIA in the last 168 hours. CBC:  Recent Labs Lab 02/14/17 1732 02/15/17 0340 02/16/17 0434  WBC 12.5* 13.6* 12.2*  NEUTROABS  --  12.0*  --   HGB 8.0* 7.4* 7.2*  HCT 23.8* 22.0* 21.8*  MCV 76.5* 76.7*  76.8*  PLT 449* 363 357   Cardiac Enzymes: No results for input(s): CKTOTAL, CKMB, CKMBINDEX, TROPONINI in the last 168 hours. BNP: Invalid input(s): POCBNP CBG:  Recent Labs Lab 02/15/17 1206 02/15/17 1650 02/15/17 2127 02/16/17 0752 02/16/17 1156  GLUCAP 149* 113* 119* 87 126*   Urinalysis    Component Value Date/Time   COLORURINE AMBER (A) 11/25/2016 0227   APPEARANCEUR CLEAR 11/25/2016 0227   LABSPEC 1.013 11/25/2016 0227   PHURINE 5.0 11/25/2016 0227   GLUCOSEU NEGATIVE 11/25/2016 0227   HGBUR MODERATE (A) 11/25/2016 0227   BILIRUBINUR NEGATIVE 11/25/2016 0227   KETONESUR NEGATIVE 11/25/2016 0227   PROTEINUR NEGATIVE 11/25/2016 0227   UROBILINOGEN 0.2 05/19/2008 0910   NITRITE NEGATIVE 11/25/2016 0227   LEUKOCYTESUR NEGATIVE 11/25/2016 0227    Time coordinating discharge: Over 30 minutes  SIGNED:   Cordelia Poche, MD [facility] 02/16/2017, 3:40 PM Pager [phone]  If 7PM-7AM, please contact night-coverage [email or web address] Password [email or web address]

## 2017-02-16 NOTE — Progress Notes (Signed)
PT Note Evaluation completed.  Full note to follow.  Pt will need 3N1 at home. Has all other equipment that is needed at current time.  Pt also will benefit from [facility] and [place].  With Hospice, they do not provide PT but feel that pt is functioning at baseline and should do well as long as family encourages pt to get up several times a day at home.  Will follow acutely.  Thanks.  [place] [phone] (pager)

## 2017-02-16 NOTE — Care Management Note (Addendum)
Case Management Note  Patient Details  Name: [name] MRN: [id number] Date of Birth: 1928/03/12  Subjective/Objective: Pt admitted on 02/14/17 with weakness, hyponatremia, anemia, and portal hypertensive gastropathy.  PTA, pt resided at home with grandson, who cares for her.                 Action/Plan: Palliative consult done today, as pt does not want aggressive measures.  Pt/family prefer dc home with Hospice services, likely today.  Patient chooses [facility] for Hospice services.  Referral made to [facility].  Pt requests hospital bed, overbed table, and 3 in 1 for home.   Expected Discharge Date:    02/16/17             Expected Discharge Plan:  Home w Hospice Care  In-House Referral:    CSW Discharge planning Services  CM Consult  Post Acute Care Choice:  Hospice Choice offered to:  Patient  DME Arranged:  3-N-1, Overbed table, Hospital bed DME Agency:  [place]:  RN, Nurse's Aide, Social Work [facility] Agency:  Hospice and [place] of Service:  Completed, signed off  If discussed at [employer], dates discussed:    Additional Comments: 02/16/17 1430 Referral to [place] for Meals on Wheels resources.  Corinna Gab, RN, BSN  Reinaldo Raddle, RN, BSN  Trauma/Neuro ICU Case Manager [phone]

## 2017-02-16 NOTE — Clinical Social Work Note (Signed)
CSW met with pt to address request for Meals on Wheels resources. Pt stated she is not interested in Meals on Wheels because she is "picky about eating people's food." Pt lives with her grandson who does the grocery shopping and prepares meals for pt. CSW confirmed pt does not have concerns about lack of funds to purchase food. CSW provided meal assistance resources to pt. Pt was tired and wanted to focus on trying to eat. Pt confirmed CSW can discuss with grandson-Prince.   CSW spoke with [name] to confirm what pt stated above. [name] stated he will look over resources provided and discuss with pt at home. [name] thanked CSW for the assistance. CSW signing off as no further SW needs identified.   [name], [place], [place] Work [phone]

## 2017-02-16 NOTE — Progress Notes (Signed)
   02/16/17 1300  Clinical Encounter Type  Visited With Patient;Health care provider  Visit Type Spiritual support  Referral From Palliative care team  Consult/Referral To Chaplain  Spiritual Encounters  Spiritual Needs Prayer;Emotional  Stress Factors  Patient Stress Factors Family relationships;Health changes    Patient discussed goals of care and transition to hospice. Provided ministry of presence, prayer, and emotional support. [name] L. Algenis Ballin, MDiv.

## 2017-02-16 NOTE — Evaluation (Signed)
Physical Therapy Evaluation Patient Details Name: [name] MRN: [id number] DOB: 11/14/1928 Today's Date: 02/16/2017   History of Present Illness  83 y.o. female  with past medical history of DMII, HTN, hypothyroidism, portal hypertensive gastropathy, GI bleeding,s/p banded bleeding esophogeal varices 12/17, elevated CA-19 and dilated intra and extra hepatic biliary ductal dilatation concerning for hepatic cancer (for which she has desired no further workup) admitted on 02/14/2017 with weakness, and anemia.   Clinical Impression  Pt admitted with above diagnosis. Pt currently with functional limitations due to the deficits listed below (see PT Problem List). Pt was able to ambulate with RW with nursing earlier and did well per nurse with RW.  Pt tired therefore limited assessment but was able to assess what f/u and equipment pt will need.  See below. Will continue PT.  Pt will benefit from skilled PT to increase their independence and safety with mobility to allow discharge to the venue listed below.    Follow Up Recommendations No PT follow up (HHaide and HHRN)    Equipment Recommendations  3in1 (PT)    Recommendations for Other Services       Precautions / Restrictions Precautions Precautions: Fall Restrictions Weight Bearing Restrictions: No      Mobility  Bed Mobility Overal bed mobility: Independent                Transfers Overall transfer level: Needs assistance Equipment used: Rolling walker (2 wheeled) Transfers: Sit to/from Stand Sit to Stand: Min guard         General transfer comment: Did not want to walk as she had walked with nursing less than an hour ago.   Ambulation/Gait                Stairs            Wheelchair Mobility    Modified Rankin (Stroke Patients Only)       Balance Overall balance assessment: Needs assistance Sitting-balance support: No upper extremity supported;Feet supported Sitting balance-Leahy Scale:  Fair     Standing balance support: Bilateral upper extremity supported;During functional activity Standing balance-Leahy Scale: Poor Standing balance comment: relies on RW for balance                             Pertinent Vitals/Pain Pain Assessment: No/denies pain  VSS  Home Living Family/patient expects to be discharged to:: Private residence Living Arrangements: Other relatives Available Help at Discharge: Family;Available PRN/intermittently (grandson works days) Type of Home: House Home Access: Stairs to enter Entrance Stairs-Rails: Right Entrance Stairs-Number of Steps: 5 Home Layout: One level;Laundry or work area in [place]: [name] - single point;Shower seat;Walker - 2 wheels;Tub bench;Other (comment) (rail on bed)      Prior Function Level of Independence: Needs assistance   Gait / Transfers Assistance Needed: Furniture walker for household ambulation.  ADL's / Homemaking Assistance Needed: Reports cooking, cleaning. Reports falls. grandson has been helping and aide was coming [place]        Extremity/Trunk Assessment   Upper Extremity Assessment Upper Extremity Assessment: Defer to OT evaluation    Lower Extremity Assessment Lower Extremity Assessment: Overall WFL for tasks assessed    Cervical / Trunk Assessment Cervical / Trunk Assessment: Normal  Communication   Communication: HOH  Cognition Arousal/Alertness: Awake/alert Behavior During Therapy: WFL for tasks assessed/performed Overall Cognitive Status: Within Functional Limits for tasks  assessed                      General Comments      Exercises     Assessment/Plan    PT Assessment Patient needs continued PT services  PT Problem List Decreased balance;Decreased activity tolerance;Decreased mobility;Decreased knowledge of use of DME;Decreased safety awareness;Decreased knowledge of precautions       PT Treatment Interventions DME  instruction;Gait training;Stair training;Functional mobility training;Therapeutic activities;Therapeutic exercise;Balance training;Patient/family education    PT Goals (Current goals can be found in the Care Plan section)  Acute Rehab PT Goals Patient Stated Goal: to go home PT Goal Formulation: With patient Time For Goal Achievement: 03/02/17 Potential to Achieve Goals: Good    Frequency Min 2X/week   Barriers to discharge        Co-evaluation               End of Session Equipment Utilized During Treatment: Gait belt Activity Tolerance: Patient limited by fatigue Patient left: in bed;with call bell/phone within reach Nurse Communication: Mobility status PT Visit Diagnosis: Muscle weakness (generalized) (M62.81)         Time: 1220-1230 PT Time Calculation (min) (ACUTE ONLY): 10 min   Charges:   PT Evaluation $PT Eval Moderate Complexity: 1 Procedure     PT G Codes:         Giovani Neumeister F Brnadon Eoff Mar 10, 2017, 2:18 PM [facility] Acute Rehabilitation [phone] (pager)

## 2017-02-16 NOTE — Progress Notes (Signed)
1928 Pt's grandson just came in. Discharge instructions given to grandson, verbalized understanding. [facility] notified of discharge at [place]. Discharged to home accompanied by grandson.

## 2017-09-28 DEATH — deceased
